# Patient Record
Sex: Female | Born: 1980 | Race: Black or African American | Hispanic: No | Marital: Single | Smoking: Current some day smoker
Health system: Southern US, Community
[De-identification: ages and names within clinical notes are randomized; demographics above are authoritative.]

## PROBLEM LIST (undated history)

## (undated) DIAGNOSIS — J45909 Unspecified asthma, uncomplicated: Secondary | ICD-10-CM

## (undated) HISTORY — PX: KNEE SURGERY: SHX244

## (undated) HISTORY — PX: COLPOSCOPY: SHX161

## (undated) HISTORY — PX: CERVICAL BIOPSY  W/ LOOP ELECTRODE EXCISION: SUR135

## (undated) HISTORY — DX: Unspecified asthma, uncomplicated: J45.909

## (undated) HISTORY — PX: OTHER SURGICAL HISTORY: SHX169

---

## 1999-01-31 ENCOUNTER — Other Ambulatory Visit: Admission: RE | Admit: 1999-01-31 | Discharge: 1999-01-31 | Payer: Self-pay | Admitting: Obstetrics and Gynecology

## 2005-09-11 ENCOUNTER — Ambulatory Visit: Payer: Self-pay | Admitting: Internal Medicine

## 2005-10-07 ENCOUNTER — Ambulatory Visit: Payer: Self-pay | Admitting: Internal Medicine

## 2006-01-13 ENCOUNTER — Emergency Department (HOSPITAL_COMMUNITY): Admission: EM | Admit: 2006-01-13 | Discharge: 2006-01-13 | Payer: Self-pay | Admitting: Emergency Medicine

## 2006-05-06 ENCOUNTER — Ambulatory Visit: Payer: Self-pay | Admitting: Internal Medicine

## 2006-07-09 ENCOUNTER — Ambulatory Visit: Payer: Self-pay | Admitting: Internal Medicine

## 2007-04-15 DIAGNOSIS — J45909 Unspecified asthma, uncomplicated: Secondary | ICD-10-CM | POA: Insufficient documentation

## 2009-03-14 ENCOUNTER — Emergency Department (HOSPITAL_COMMUNITY): Admission: EM | Admit: 2009-03-14 | Discharge: 2009-03-14 | Payer: Self-pay | Admitting: Emergency Medicine

## 2012-09-07 ENCOUNTER — Encounter: Payer: Self-pay | Admitting: Family Medicine

## 2012-09-07 ENCOUNTER — Ambulatory Visit (INDEPENDENT_AMBULATORY_CARE_PROVIDER_SITE_OTHER): Payer: Self-pay | Admitting: Family Medicine

## 2012-09-07 VITALS — BP 98/80 | HR 92 | Temp 100.0°F | Ht 71.5 in | Wt 171.0 lb

## 2012-09-07 DIAGNOSIS — Z87898 Personal history of other specified conditions: Secondary | ICD-10-CM | POA: Insufficient documentation

## 2012-09-07 DIAGNOSIS — J329 Chronic sinusitis, unspecified: Secondary | ICD-10-CM

## 2012-09-07 DIAGNOSIS — N912 Amenorrhea, unspecified: Secondary | ICD-10-CM

## 2012-09-07 DIAGNOSIS — F17200 Nicotine dependence, unspecified, uncomplicated: Secondary | ICD-10-CM

## 2012-09-07 DIAGNOSIS — Z8742 Personal history of other diseases of the female genital tract: Secondary | ICD-10-CM

## 2012-09-07 DIAGNOSIS — Z720 Tobacco use: Secondary | ICD-10-CM | POA: Insufficient documentation

## 2012-09-07 MED ORDER — AMOXICILLIN-POT CLAVULANATE 875-125 MG PO TABS: 1.0000 | ORAL_TABLET | Freq: Two times a day (BID) | ORAL | Status: DC

## 2012-09-07 MED ORDER — NICOTINE POLACRILEX 2 MG MT GUM: 2.0000 mg | CHEWING_GUM | OROMUCOSAL | Status: DC | PRN

## 2012-09-07 NOTE — Progress Notes (Signed)
Chief Complaint  Patient presents with  . Establish Care  . Cough    fever, facial pain, sinus pressure, rattling in chest, SOB, fatigue, chest pain and lower back pain, headache    HPI:  Catherine Diaz is here to establish care.  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 ASTHMA - dx in several years ago when had bronchitis, no symptoms since  . Tobacco use   . Sinusitis  . History of abnormal Pap smear   Sinus congestion: -started about 1 month ago, got better but then worse again over the last week -has had: nasal congestion, cough, sinus pain on R side, drainage in throat, L ear pain -denies: fever, NVD, tooth pain -hx of sinusitis -has tried musinex, sudafed, OTC medications   Tobacco Use: -quiting over last few weeks -down to 2 cigarettes per day -using E cigarette - but going to stop this too -motivated to quit  Health Maintenance: -needs pap - she reports she will call gyn - wants to see gyn due to her hx of LEEP and long term amenorrhea since she was a teenager  ROS: See pertinent positives and negatives per HPI.  Past Medical History  Diagnosis Date  . Asthma     Family History  Problem Relation Age of Onset  . Adopted: Yes  . Family history unknown: Yes    History   Social History  . Marital Status: Single    Spouse Name: N/A    Number of Children: N/A  . Years of Education: N/A   Social History Main Topics  . Smoking status: Current Every Day Smoker    Types: Cigarettes  . Smokeless tobacco: None     Comment: per patient 2 cig a day   . Alcohol Use: Yes     Comment: social - 2 drinks once per week  . Drug Use: No  . Sexually Active: Yes    Birth Control/ Protection: Condom     Comment: female, one   Other Topics Concern  . None   Social History Narrative   Work or School: works in [employer] Situation: living  aloneSpiritual Beliefs: christianLifestyle: no regular exercise, poor diet    Current outpatient prescriptions:amoxicillin-clavulanate (AUGMENTIN) 875-125 MG per tablet, Take 1 tablet by mouth 2 (two) times daily., Disp: 20 tablet, Rfl: 0;  nicotine polacrilex (EQ NICOTINE) 2 MG gum, Take 1 each (2 mg total) by mouth as needed for smoking cessation., Disp: 100 tablet, Rfl: 0  EXAM:  Filed Vitals:   09/07/12 1428  BP: 98/80  Pulse: 92  Temp: 100 F (37.8 C)    Body mass index is 23.52 kg/(m^2).  GENERAL: vitals reviewed and listed above, alert, oriented, appears well hydrated and in no acute distress  HEENT: atraumatic, conjunttiva clear, no obvious abnormalities on inspection of external nose and ears, normal appearance of ear canals and TMs, clear nasal congestion, mild post oropharyngeal erythema with PND, no tonsillar edema or exudate, no sinus TTP  NECK: no obvious masses on inspection  LUNGS: clear to auscultation bilaterally, no wheezes, rales or rhonchi, good air movement  CV: HRRR, no peripheral edema  MS: moves all extremities without noticeable abnormality  PSYCH: pleasant and cooperative, no obvious depression or anxiety  ASSESSMENT AND PLAN:  Discussed the following assessment and plan:  1. Tobacco use  nicotine polacrilex (EQ NICOTINE) 2 MG gum  2.  Sinusitis  amoxicillin-clavulanate (AUGMENTIN) 875-125 MG per tablet  3. History of abnormal Pap smear  Ambulatory referral to Gynecology  4. Amenorrhea  Ambulatory referral to Gynecology   -We reviewed the PMH, PSH, FH, SH, Meds and Allergies. -smoking cessation for > 5 minutes - discussed tx options, motivated to quit, she will try nicotine gum - rx given -for her sinusitis will do augmentin- risks discussed, return precautions -she will see the gynecologist for her hx for pap smear and amenorrhea (chronic) -she will follow up in 1 month and will do fasting labs then as she does not want to do them today given  insurance starts next week  -Patient advised to return or notify a doctor immediately if symptoms worsen or persist or new concerns arise.  Patient Instructions  -We have ordered labs or studies at this visit. It can take up to 1-2 weeks for results and processing. We will contact you with instructions IF your results are abnormal. Normal results will be released to your [facility]. If you have not heard from [place] or can not find your results in [facility] in 2 weeks please contact our office.  -PLEASE SIGN UP FOR MYCHART TODAY   Start the Augmenting for your sinus infection: -As we discussed, we have prescribed a new medication for you at this appointment. We discussed the common and serious potential adverse effects of this medication and you can review these and more with the pharmacist when you pick up your medication.  Please follow the instructions for use carefully and notify us immediately if you have any problems taking this medication.  Get rid of all cigarettes. Use the nicotine gum as needed for cravings per instructions. Follow up in 1 month.  We recommend the following healthy lifestyle measures: - eat a healthy diet consisting of lots of vegetables, fruits, beans, nuts, seeds, healthy meats such as white chicken and fish and whole grains.  - avoid fried foods, fast food, processed foods, sodas, red meet and other fattening foods.  - get a least 150 minutes of aerobic exercise per week.   Follow up in: 1 month for early morning appointment for labs and tobacco use follow up      Catherine Diaz, [facility] R.

## 2012-09-07 NOTE — Patient Instructions (Addendum)
-  We have ordered labs or studies at this visit. It can take up to 1-2 weeks for results and processing. We will contact you with instructions IF your results are abnormal. Normal results will be released to your [facility]. If you have not heard from [place] or can not find your results in [facility] in 2 weeks please contact our office.  -PLEASE SIGN UP FOR MYCHART TODAY   Start the Augmenting for your sinus infection: -As we discussed, we have prescribed a new medication for you at this appointment. We discussed the common and serious potential adverse effects of this medication and you can review these and more with the pharmacist when you pick up your medication.  Please follow the instructions for use carefully and notify us immediately if you have any problems taking this medication.  Get rid of all cigarettes. Use the nicotine gum as needed for cravings per instructions. Follow up in 1 month.  We recommend the following healthy lifestyle measures: - eat a healthy diet consisting of lots of vegetables, fruits, beans, nuts, seeds, healthy meats such as white chicken and fish and whole grains.  - avoid fried foods, fast food, processed foods, sodas, red meet and other fattening foods.  - get a least 150 minutes of aerobic exercise per week.   Follow up in: 1 month for early morning appointment for labs and tobacco use follow up

## 2012-09-08 ENCOUNTER — Encounter: Payer: Self-pay | Admitting: Family Medicine

## 2012-10-05 ENCOUNTER — Encounter: Payer: Self-pay | Admitting: Family Medicine

## 2012-10-05 DIAGNOSIS — Z0289 Encounter for other administrative examinations: Secondary | ICD-10-CM

## 2012-10-05 NOTE — Progress Notes (Signed)
Late cancel or no show  This encounter was created in error - please disregard.

## 2013-03-25 ENCOUNTER — Other Ambulatory Visit: Payer: Self-pay | Admitting: *Deleted

## 2013-03-25 DIAGNOSIS — N6320 Unspecified lump in the left breast, unspecified quadrant: Secondary | ICD-10-CM

## 2013-03-29 ENCOUNTER — Encounter (HOSPITAL_COMMUNITY): Payer: Self-pay

## 2013-03-29 ENCOUNTER — Ambulatory Visit (HOSPITAL_COMMUNITY)
Admission: RE | Admit: 2013-03-29 | Discharge: 2013-03-29 | Disposition: A | Discharge status: Home / Self Care | Payer: Self-pay | Source: Ambulatory Visit | Attending: Obstetrics and Gynecology | Admitting: Obstetrics and Gynecology

## 2013-03-29 VITALS — BP 124/82 | Temp 98.5°F | Ht 72.0 in | Wt 184.6 lb

## 2013-03-29 DIAGNOSIS — N6313 Unspecified lump in the right breast, lower outer quadrant: Secondary | ICD-10-CM

## 2013-03-29 DIAGNOSIS — Z01419 Encounter for gynecological examination (general) (routine) without abnormal findings: Secondary | ICD-10-CM

## 2013-03-29 DIAGNOSIS — N6322 Unspecified lump in the left breast, upper inner quadrant: Secondary | ICD-10-CM | POA: Insufficient documentation

## 2013-03-29 NOTE — Patient Instructions (Addendum)
Taught Catherine Diaz how to perform BSE and gave educational materials to take home.  Let patient know that she will need a Pap smear in 1 year if today's Pap smear is normal since her last Pap smear was abnormal. Referred patient to the [facility] for diagnostic mammogram and left breast ultrasound. Appointment scheduled for Thursday, April 14, 2013 at 1400. Patient aware of appointment and will be there. Let patient know will follow up with her within the next couple weeks with results by phone. Smoking cessation discussed with patient. [name] verbalized understanding.  [place], Kathaleen Maser, RN 4:19 PM

## 2013-03-29 NOTE — Progress Notes (Signed)
Patient complained of a left breast lump x 1 month that is painful. Patient rated pain at a 10 out of 10.  Pap Smear:    Pap smear completed today. Patients last Pap smear was in 2009 and abnormal per patient. Per patient has a history of a LEEP in 2009 to follow up for the abnormal Pap smear. Patient has not had a follow up Pap smear since last abnormal Pap smear. No Pap smear results in EPIC.  Physical exam: Breasts Breasts symmetrical. No skin abnormalities bilateral breasts. No nipple retraction bilateral breasts. No nipple discharge bilateral breasts. No lymphadenopathy. No lumps palpated right breast. Palpated two pea sized lumps in the left breast at 10 o'clocl 6 cm from the nipple and 8 o'clock 7 cm from the nipple. Patient complained of tenderness when palpated left lower inner breast where lumps were palpated. Referred patient to the [facility] for diagnostic mammogram and left breast ultrasound. Appointment scheduled for Thursday, April 14, 2013 at 1400.       Pelvic/Bimanual   Ext Genitalia No lesions, no swelling and no discharge observed on external genitalia.         Vagina Vagina pink and normal texture. No lesions or discharge observed in vagina.          Cervix Cervix is present. Cervix pink and of normal texture. No discharge observed.     Uterus Uterus is present and palpable. Uterus in normal position and normal size.        Adnexae Bilateral ovaries present and palpable. No tenderness on palpation.          Rectovaginal No rectal exam completed today since patient had no rectal complaints. No skin abnormalities observed on exam.

## 2013-03-31 ENCOUNTER — Other Ambulatory Visit: Payer: Self-pay | Admitting: *Deleted

## 2013-03-31 DIAGNOSIS — A599 Trichomoniasis, unspecified: Secondary | ICD-10-CM

## 2013-03-31 MED ORDER — METRONIDAZOLE 500 MG PO TABS: 500.0000 mg | ORAL_TABLET | Freq: Two times a day (BID) | ORAL | Status: DC

## 2013-04-01 ENCOUNTER — Telehealth (HOSPITAL_COMMUNITY): Payer: Self-pay | Admitting: *Deleted

## 2013-04-01 NOTE — Telephone Encounter (Signed)
Telephoned patient at home # and discussed pap smear was normal. Also advised pap did show trichomonas and would need to take medication for this. Patient's partner would also need to be treated. Due to past LEEP procedure and abnormal pap smears patient's next pap due in one year. Patient voiced understanding.

## 2013-04-14 ENCOUNTER — Ambulatory Visit
Admission: RE | Admit: 2013-04-14 | Discharge: 2013-04-14 | Disposition: A | Discharge status: Home / Self Care | Payer: No Typology Code available for payment source | Source: Ambulatory Visit | Attending: Obstetrics and Gynecology | Admitting: Obstetrics and Gynecology

## 2013-04-14 DIAGNOSIS — N6320 Unspecified lump in the left breast, unspecified quadrant: Secondary | ICD-10-CM

## 2013-08-06 ENCOUNTER — Emergency Department (HOSPITAL_COMMUNITY)
Admission: EM | Admit: 2013-08-06 | Discharge: 2013-08-06 | Disposition: A | Discharge status: Home / Self Care | Payer: Self-pay | Attending: Emergency Medicine | Admitting: Emergency Medicine

## 2013-08-06 ENCOUNTER — Emergency Department (HOSPITAL_COMMUNITY): Payer: Self-pay

## 2013-08-06 DIAGNOSIS — Z91040 Latex allergy status: Secondary | ICD-10-CM | POA: Insufficient documentation

## 2013-08-06 DIAGNOSIS — R112 Nausea with vomiting, unspecified: Secondary | ICD-10-CM | POA: Insufficient documentation

## 2013-08-06 DIAGNOSIS — R000 Tachycardia, unspecified: Secondary | ICD-10-CM | POA: Insufficient documentation

## 2013-08-06 DIAGNOSIS — J45909 Unspecified asthma, uncomplicated: Secondary | ICD-10-CM | POA: Insufficient documentation

## 2013-08-06 DIAGNOSIS — M545 Low back pain, unspecified: Secondary | ICD-10-CM | POA: Insufficient documentation

## 2013-08-06 DIAGNOSIS — Z3202 Encounter for pregnancy test, result negative: Secondary | ICD-10-CM | POA: Insufficient documentation

## 2013-08-06 DIAGNOSIS — F17200 Nicotine dependence, unspecified, uncomplicated: Secondary | ICD-10-CM | POA: Insufficient documentation

## 2013-08-06 DIAGNOSIS — B9789 Other viral agents as the cause of diseases classified elsewhere: Secondary | ICD-10-CM | POA: Insufficient documentation

## 2013-08-06 DIAGNOSIS — R197 Diarrhea, unspecified: Secondary | ICD-10-CM | POA: Insufficient documentation

## 2013-08-06 DIAGNOSIS — R630 Anorexia: Secondary | ICD-10-CM | POA: Insufficient documentation

## 2013-08-06 DIAGNOSIS — J020 Streptococcal pharyngitis: Secondary | ICD-10-CM | POA: Insufficient documentation

## 2013-08-06 DIAGNOSIS — R109 Unspecified abdominal pain: Secondary | ICD-10-CM | POA: Insufficient documentation

## 2013-08-06 DIAGNOSIS — R079 Chest pain, unspecified: Secondary | ICD-10-CM | POA: Insufficient documentation

## 2013-08-06 LAB — COMPREHENSIVE METABOLIC PANEL
ALT: 27 U/L (ref 0–35)
AST: 25 U/L (ref 0–37)
Alkaline Phosphatase: 96 U/L (ref 39–117)
Calcium: 9.3 mg/dL (ref 8.4–10.5)
Glucose, Bld: 107 mg/dL — ABNORMAL HIGH (ref 70–99)
Sodium: 133 mEq/L — ABNORMAL LOW (ref 135–145)
Total Protein: 7.5 g/dL (ref 6.0–8.3)

## 2013-08-06 LAB — POCT PREGNANCY, URINE: Preg Test, Ur: NEGATIVE

## 2013-08-06 LAB — CBC WITH DIFFERENTIAL/PLATELET
HCT: 39.4 % (ref 36.0–46.0)
Hemoglobin: 13.3 g/dL (ref 12.0–15.0)
Lymphocytes Relative: 9 % — ABNORMAL LOW (ref 12–46)
Lymphs Abs: 1.6 10*3/uL (ref 0.7–4.0)
MCV: 89.3 fL (ref 78.0–100.0)
Monocytes Relative: 12 % (ref 3–12)
Neutro Abs: 13.4 10*3/uL — ABNORMAL HIGH (ref 1.7–7.7)
Platelets: 258 10*3/uL (ref 150–400)

## 2013-08-06 LAB — LIPASE, BLOOD: Lipase: 25 U/L (ref 11–59)

## 2013-08-06 LAB — RAPID STREP SCREEN (MED CTR MEBANE ONLY): Streptococcus, Group A Screen (Direct): POSITIVE — AB

## 2013-08-06 MED ORDER — OXYCODONE-ACETAMINOPHEN 5-325 MG PO TABS: 2.0000 | ORAL_TABLET | ORAL | Status: DC | PRN

## 2013-08-06 MED ORDER — SODIUM CHLORIDE 0.9 % IV BOLUS (SEPSIS)
1000.0000 mL | Freq: Once | INTRAVENOUS | Status: AC
  Administered 2013-08-06: 1000 mL via INTRAVENOUS

## 2013-08-06 MED ORDER — ACETAMINOPHEN 325 MG PO TABS
650.0000 mg | ORAL_TABLET | Freq: Four times a day (QID) | ORAL | Status: DC | PRN
  Administered 2013-08-06 (×2): 650 mg via ORAL
  Filled 2013-08-06 (×2): qty 2

## 2013-08-06 MED ORDER — MAGIC MOUTHWASH
5.0000 mL | Freq: Once | ORAL | Status: AC
  Administered 2013-08-06: 5 mL via ORAL
  Filled 2013-08-06: qty 5

## 2013-08-06 MED ORDER — GUAIFENESIN 100 MG/5ML PO LIQD: 100.0000 mg | ORAL | Status: DC | PRN

## 2013-08-06 MED ORDER — PENICILLIN G BENZATHINE 1200000 UNIT/2ML IM SUSP
1.2000 10*6.[IU] | Freq: Once | INTRAMUSCULAR | Status: AC
  Administered 2013-08-06: 1.2 10*6.[IU] via INTRAMUSCULAR
  Filled 2013-08-06: qty 2

## 2013-08-06 NOTE — ED Notes (Signed)
Pt c/o NVD, fever, cough since christmas eve.

## 2013-08-06 NOTE — ED Provider Notes (Signed)
CSN: [id number]     Arrival date & time 08/06/13  1236 History   First MD Initiated Contact with Patient 08/06/13 1505     Chief Complaint  Patient presents with  . Fever  . Generalized Body Aches  . Nausea  . Emesis  . Diarrhea   (Consider location/radiation/quality/duration/timing/severity/associated sxs/prior Treatment) HPI  32 year old female with history of asthma presents with viral syndrome. Patient states she has had a cold for the past weeks which has improved however 3 days ago on Christmas eve her cold sxs returns. It begins with sore throat, body aches, fever, nonproductive cough, ear pain, and neck pain. She also states cough is worse in the middle of the night but improves throughout the day. She reports having nausea and has vomited 5-10 times with nonbilious non-bloody vomits 2 days ago but that has improved. She reports having chest pain abdominal pain and low back pain. She has tried taking over-the-counter TheraFlu with minimal relief. She has decreased appetite but is able to drink fluid. She has history of recurrent strep throat. Her asthma is otherwise well controlled. She denies hemoptysis, neck stiffness, shortness of breath, dysuria, hematuria, or rash. She is a nonsmoker.  Past Medical History  Diagnosis Date  . Asthma    Past Surgical History  Procedure Laterality Date  . Knee surgery    . Leep surgery 2009    . Colposcopy    . Cervical biopsy  w/ loop electrode excision     Family History  Problem Relation Age of Onset  . Adopted: Yes   History  Substance Use Topics  . Smoking status: Current Every Day Smoker    Types: Cigarettes  . Smokeless tobacco: Not on file     Comment: per patient 2 cig a day   . Alcohol Use: Yes     Comment: social - 2 drinks once per week   OB History   Grav Para Term Preterm Abortions TAB SAB Ect Mult Living   0              Review of Systems  All other systems reviewed and are negative.    Allergies   Latex  Home Medications   Current Outpatient Rx  Name  Route  Sig  Dispense  Refill  . nicotine polacrilex (EQ NICOTINE) 2 MG gum   Oral   Take 1 each (2 mg total) by mouth as needed for smoking cessation.   100 tablet   0    BP 117/77  Pulse 105  Temp(Src) 101.3 F (38.5 C) (Oral)  Resp 16  SpO2 100% Physical Exam  Nursing note and vitals reviewed. Constitutional: She is oriented to person, place, and time. She appears well-developed and well-nourished. No distress.  HENT:  Head: Atraumatic.  Right Ear: External ear normal.  Left Ear: External ear normal.  Ear: TM normal bilaterally  Nose: rhinorrhea  Throat: uvula midline, bilateral tonsillar enlargement and exudates.  Post tonsillar erythema.  No obvious evidence of deep tissue infection, no trismus.  Eyes: Conjunctivae are normal.  Neck: Neck supple.  No nuchal rigidity  Cardiovascular:  Mild tachycardia without M/R/G  Pulmonary/Chest: Effort normal. No respiratory distress. She has no wheezes. She has no rales.  Abdominal: Soft. There is no tenderness. There is no rigidity, no rebound, no CVA tenderness, no tenderness at McBurney's point and negative Murphy's sign. No hernia.  Lymphadenopathy:    She has cervical adenopathy.  Neurological: She is alert and oriented to person, place,  and time.  Skin: Skin is warm. No rash noted.  Psychiatric: She has a normal mood and affect.    ED Course  Procedures (including critical care time)  3:25 PM Pt here with URI sxs.  Has fever of 101.3, has leukocytosis of 17.2.  Is mildly tachycardic.  Suspect flu sxs, will obtain rapid strep test to r/o strept infection.  Will check CXR to r/o PNA.    6:01 PM Rapid strep test is positive for group A strep. Patient will receive Bicillin 1.2 million unit IM. Chest x-ray shows no evidence of pneumonia. Patient is currently able to tolerates by mouth. No obvious evidence of peritonsillar abscess or deep tissue infection. Magic  mouthwash given, IVF given  Labs Review Labs Reviewed  CBC WITH DIFFERENTIAL - Abnormal; Notable for the following:    WBC 17.2 (*)    Neutrophils Relative % 78 (*)    Neutro Abs 13.4 (*)    Lymphocytes Relative 9 (*)    Monocytes Absolute 2.1 (*)    All other components within normal limits  COMPREHENSIVE METABOLIC PANEL - Abnormal; Notable for the following:    Sodium 133 (*)    Glucose, Bld 107 (*)    Total Bilirubin 0.2 (*)    All other components within normal limits  LIPASE, BLOOD   Imaging Review Dg Chest 2 View  08/06/2013   CLINICAL DATA:  Cough, sore throat, body aches for 3 days, fever  EXAM: CHEST  2 VIEW  COMPARISON:  None  FINDINGS: Normal heart size, mediastinal contours, and pulmonary vascularity.  Lungs clear.  No pleural effusion or pneumothorax.  Bones unremarkable.  Cutaneous artifacts project over the sternum.  IMPRESSION: No acute abnormalities.   Electronically Signed   By: Ulyses Southward M.D.   On: 08/06/2013 15:36    EKG Interpretation   None       MDM   1. Strep pharyngitis    BP 115/66  Pulse 87  Temp(Src) 99 F (37.2 C) (Oral)  Resp 16  SpO2 97%  I have reviewed nursing notes and vital signs. I personally reviewed the imaging tests through PACS system  I reviewed available ER/hospitalization records thought the EMR     Fayrene Helper, [place] 08/06/13 1900

## 2013-08-06 NOTE — ED Notes (Signed)
Patient was educated not to drive, operate heavy machinery, or drink alcohol while taking narcotic medication.  [place], PA, made aware of pt's temperature upon discharge. PA states that patient is safe to go home after PRN tylenol.

## 2013-08-07 NOTE — ED Provider Notes (Signed)
Medical screening examination/treatment/procedure(s) were performed by non-physician practitioner and as supervising physician I was immediately available for consultation/collaboration.  EKG Interpretation   None         Beaulah Romanek S Tenille Morrill, MD 08/07/13 1152 

## 2013-11-01 ENCOUNTER — Ambulatory Visit (INDEPENDENT_AMBULATORY_CARE_PROVIDER_SITE_OTHER): Payer: No Typology Code available for payment source | Admitting: Family Medicine

## 2013-11-01 ENCOUNTER — Encounter: Payer: Self-pay | Admitting: Family Medicine

## 2013-11-01 VITALS — BP 110/80 | Temp 99.4°F | Wt 173.0 lb

## 2013-11-01 DIAGNOSIS — J020 Streptococcal pharyngitis: Secondary | ICD-10-CM

## 2013-11-01 DIAGNOSIS — J029 Acute pharyngitis, unspecified: Secondary | ICD-10-CM

## 2013-11-01 LAB — POCT RAPID STREP A (OFFICE): Rapid Strep A Screen: POSITIVE — AB

## 2013-11-01 MED ORDER — AMOXICILLIN 875 MG PO TABS: 875.0000 mg | ORAL_TABLET | Freq: Two times a day (BID) | ORAL | Status: DC

## 2013-11-01 NOTE — Progress Notes (Signed)
Pre visit review using our clinic review tool, if applicable. No additional management support is needed unless otherwise documented below in the visit note. 

## 2013-11-01 NOTE — Progress Notes (Signed)
Chief Complaint  Patient presents with  . Sore Throat    HPI:  -started: 3-4 days ago -symptoms:sore throat, malaise, sub fever -denies: SOB, NVD, tooth pain -has tried: nothing -sick contacts/travel/risks: denies flu exposure or Ebola risks but others with similar symptoms at work  ROS: See pertinent positives and negatives per HPI.  Past Medical History  Diagnosis Date  . Asthma     Past Surgical History  Procedure Laterality Date  . Knee surgery    . Leep surgery 2009    . Colposcopy    . Cervical biopsy  w/ loop electrode excision      Family History  Problem Relation Age of Onset  . Adopted: Yes    History   Social History  . Marital Status: Single    Spouse Name: N/A    Number of Children: N/A  . Years of Education: N/A   Social History Main Topics  . Smoking status: Current Every Day Smoker    Types: Cigarettes  . Smokeless tobacco: None     Comment: per patient 2 cig a day   . Alcohol Use: Yes     Comment: social - 2 drinks once per week  . Drug Use: No  . Sexual Activity: Yes    Birth Control/ Protection: Condom     Comment: female, one   Other Topics Concern  . None   Social History Narrative   Work or School: works in [employer]      Home Situation: living alone      Spiritual Beliefs: christian      Lifestyle: no regular exercise, poor diet             Current outpatient prescriptions:acetaminophen (TYLENOL) 160 MG/5ML liquid, Take 15 mg/kg by mouth every 4 (four) hours as needed for fever or pain., Disp: , Rfl: ;  ibuprofen (ADVIL,MOTRIN) 200 MG tablet, Take 200 mg by mouth every 6 (six) hours as needed., Disp: , Rfl:   EXAM:  Filed Vitals:   11/01/13 1356  BP: 110/80  Temp: 99.4 F (37.4 C)    Body mass index is 23.46 kg/(m^2).  GENERAL: vitals reviewed and listed above, alert, oriented, appears well hydrated and in no acute distress  HEENT: atraumatic, conjunttiva clear, no obvious abnormalities on inspection of external  nose and ears, normal appearance of ear canals and TMs, mild post oropharyngeal erythema , 2+ tonsillar edema with exudate, no sinus TTP  NECK: no obvious masses on inspection  LUNGS: clear to auscultation bilaterally, no wheezes, rales or rhonchi, good air movement  CV: HRRR, no peripheral edema  MS: moves all extremities without noticeable abnormality  PSYCH: pleasant and cooperative, no obvious depression or anxiety  ASSESSMENT AND PLAN:  Discussed the following assessment and plan:  Streptococcal sore throat  Sore throat - Plan: POC Rapid Strep A, Throat culture ([facility])  -rapid strep + -tx with amox and advise to see ENT, number provided as recurrent pharyngitis -of course, we advised to return or notify a doctor immediately if symptoms worsen or persist or new concerns arise.    There are no Patient Instructions on file for this visit.   Kriste BasqueKIM, Caspar Favila R.

## 2013-11-01 NOTE — Patient Instructions (Signed)
-  As we discussed, we have prescribed a new medication for you at this appointment. We discussed the common and serious potential adverse effects of this medication and you can review these and more with the pharmacist when you pick up your medication.  Please follow the instructions for use carefully and notify us immediately if you have any problems taking this medication.  -call for appointment with ENT given recurrent pharyngitis

## 2013-11-02 ENCOUNTER — Telehealth: Payer: Self-pay | Admitting: Family Medicine

## 2013-11-02 NOTE — Telephone Encounter (Signed)
Relevant patient education assigned to patient using Emmi. ° °

## 2015-06-01 ENCOUNTER — Emergency Department (HOSPITAL_COMMUNITY): Payer: No Typology Code available for payment source

## 2015-06-01 ENCOUNTER — Emergency Department (HOSPITAL_COMMUNITY)
Admission: EM | Admit: 2015-06-01 | Discharge: 2015-06-01 | Disposition: A | Discharge status: Home / Self Care | Payer: No Typology Code available for payment source | Attending: Emergency Medicine | Admitting: Emergency Medicine

## 2015-06-01 ENCOUNTER — Encounter (HOSPITAL_COMMUNITY): Payer: Self-pay | Admitting: Emergency Medicine

## 2015-06-01 DIAGNOSIS — S0993XA Unspecified injury of face, initial encounter: Secondary | ICD-10-CM | POA: Diagnosis present

## 2015-06-01 DIAGNOSIS — Y9389 Activity, other specified: Secondary | ICD-10-CM | POA: Diagnosis not present

## 2015-06-01 DIAGNOSIS — Y998 Other external cause status: Secondary | ICD-10-CM | POA: Diagnosis not present

## 2015-06-01 DIAGNOSIS — J45909 Unspecified asthma, uncomplicated: Secondary | ICD-10-CM | POA: Insufficient documentation

## 2015-06-01 DIAGNOSIS — Y92481 Parking lot as the place of occurrence of the external cause: Secondary | ICD-10-CM | POA: Diagnosis not present

## 2015-06-01 DIAGNOSIS — Z720 Tobacco use: Secondary | ICD-10-CM | POA: Diagnosis not present

## 2015-06-01 DIAGNOSIS — S199XXA Unspecified injury of neck, initial encounter: Secondary | ICD-10-CM | POA: Insufficient documentation

## 2015-06-01 DIAGNOSIS — Z91040 Latex allergy status: Secondary | ICD-10-CM | POA: Insufficient documentation

## 2015-06-01 DIAGNOSIS — S0083XA Contusion of other part of head, initial encounter: Secondary | ICD-10-CM | POA: Diagnosis not present

## 2015-06-01 MED ORDER — OXYCODONE-ACETAMINOPHEN 5-325 MG PO TABS
2.0000 | ORAL_TABLET | Freq: Once | ORAL | Status: AC
  Administered 2015-06-01: 2 via ORAL
  Filled 2015-06-01: qty 2

## 2015-06-01 MED ORDER — HYDROCODONE-ACETAMINOPHEN 5-325 MG PO TABS
1.0000 | ORAL_TABLET | ORAL | Status: DC | PRN
Start: 2015-06-01 — End: 2016-02-16
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
## 2015-06-01 NOTE — ED Notes (Signed)
Ice pack placed on patient's left face.
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
## 2015-11-23 ENCOUNTER — Emergency Department (HOSPITAL_COMMUNITY)
Admission: EM | Admit: 2015-11-23 | Discharge: 2015-11-23 | Disposition: A | Discharge status: Home / Self Care | Payer: No Typology Code available for payment source | Attending: Emergency Medicine | Admitting: Emergency Medicine

## 2015-11-23 ENCOUNTER — Emergency Department (HOSPITAL_COMMUNITY): Payer: No Typology Code available for payment source

## 2015-11-23 ENCOUNTER — Encounter (HOSPITAL_COMMUNITY): Payer: Self-pay | Admitting: Emergency Medicine

## 2015-11-23 DIAGNOSIS — R05 Cough: Secondary | ICD-10-CM | POA: Insufficient documentation

## 2015-11-23 DIAGNOSIS — F17210 Nicotine dependence, cigarettes, uncomplicated: Secondary | ICD-10-CM | POA: Insufficient documentation

## 2015-11-23 DIAGNOSIS — J45909 Unspecified asthma, uncomplicated: Secondary | ICD-10-CM | POA: Insufficient documentation

## 2015-11-23 DIAGNOSIS — R079 Chest pain, unspecified: Secondary | ICD-10-CM | POA: Insufficient documentation

## 2015-11-23 DIAGNOSIS — R0981 Nasal congestion: Secondary | ICD-10-CM | POA: Insufficient documentation

## 2015-11-23 DIAGNOSIS — J029 Acute pharyngitis, unspecified: Secondary | ICD-10-CM | POA: Insufficient documentation

## 2015-11-23 NOTE — ED Notes (Signed)
Pt c/o midsternal chest pain radiating to back, sore throat, chills, nasal congestion and cough x 1 week. Pt here with fiance, same s/s

## 2015-11-23 NOTE — ED Notes (Addendum)
Pt walked out the ED without notifying staff. EDP notified.

## 2015-12-20 ENCOUNTER — Emergency Department (HOSPITAL_COMMUNITY): Payer: No Typology Code available for payment source

## 2015-12-20 ENCOUNTER — Emergency Department (HOSPITAL_COMMUNITY)
Admission: EM | Admit: 2015-12-20 | Discharge: 2015-12-20 | Disposition: A | Discharge status: Home / Self Care | Payer: Self-pay | Attending: Emergency Medicine | Admitting: Emergency Medicine

## 2015-12-20 ENCOUNTER — Encounter (HOSPITAL_COMMUNITY): Payer: Self-pay | Admitting: Nurse Practitioner

## 2015-12-20 ENCOUNTER — Other Ambulatory Visit: Payer: Self-pay

## 2015-12-20 DIAGNOSIS — M940 Chondrocostal junction syndrome [Tietze]: Secondary | ICD-10-CM | POA: Insufficient documentation

## 2015-12-20 DIAGNOSIS — J45901 Unspecified asthma with (acute) exacerbation: Secondary | ICD-10-CM | POA: Insufficient documentation

## 2015-12-20 DIAGNOSIS — F17210 Nicotine dependence, cigarettes, uncomplicated: Secondary | ICD-10-CM | POA: Insufficient documentation

## 2015-12-20 DIAGNOSIS — Z91040 Latex allergy status: Secondary | ICD-10-CM | POA: Insufficient documentation

## 2015-12-20 LAB — BASIC METABOLIC PANEL
Anion gap: 11 (ref 5–15)
BUN: 13 mg/dL (ref 6–20)
CHLORIDE: 105 mmol/L (ref 101–111)
CO2: 26 mmol/L (ref 22–32)
CREATININE: 1.03 mg/dL — AB (ref 0.44–1.00)
Calcium: 9.4 mg/dL (ref 8.9–10.3)
GFR calc non Af Amer: >60 mL/min (ref >60)
Glucose, Bld: 81 mg/dL (ref 65–99)
Potassium: 4.3 mmol/L (ref 3.5–5.1)
Sodium: 142 mmol/L (ref 135–145)

## 2015-12-20 LAB — I-STAT TROPONIN, ED: Troponin i, poc: 0 ng/mL (ref 0.00–0.08)

## 2015-12-20 LAB — CBC
HCT: 40.9 % (ref 36.0–46.0)
Hemoglobin: 13.1 g/dL (ref 12.0–15.0)
MCH: 28.9 pg (ref 26.0–34.0)
MCHC: 32 g/dL (ref 30.0–36.0)
MCV: 90.1 fL (ref 78.0–100.0)
PLATELETS: 284 10*3/uL (ref 150–400)
RBC: 4.54 MIL/uL (ref 3.87–5.11)
RDW: 13.2 % (ref 11.5–15.5)
WBC: 5.9 10*3/uL (ref 4.0–10.5)

## 2015-12-20 MED ORDER — METHOCARBAMOL 500 MG PO TABS: 500.0000 mg | ORAL_TABLET | Freq: Two times a day (BID) | ORAL | Status: DC

## 2015-12-20 MED ORDER — NAPROXEN 500 MG PO TABS: 500.0000 mg | ORAL_TABLET | Freq: Two times a day (BID) | ORAL | Status: DC

## 2015-12-20 NOTE — ED Notes (Signed)
She c/o 2 day history of chest pain. Onset after reaching to help a falling person. Pain has gotten worse since onset and now radiating into shoulder and back. She reports some mild intermittent sob as well. Pain increased with movement. Describes as "burning, pulling." she took aleve and applied ice with no relief. she is alert and breathing easily.

## 2015-12-20 NOTE — ED Notes (Signed)
Catherine ManisElizabeth RN made aware of pt HR

## 2015-12-20 NOTE — Discharge Instructions (Signed)
Return to the ER if you developed cough with bloody sputum, increase shortness of breath, or if you have other concerns.  Chest Wall Pain Chest wall pain is pain in or around the bones and muscles of your chest. Sometimes, an injury causes this pain. Sometimes, the cause may not be known. This pain may take several weeks or longer to get better. HOME CARE Pay attention to any changes in your symptoms. Take these actions to help with your pain:  Rest as told by your doctor.  Avoid activities that cause pain. Try not to use your chest, belly (abdominal), or side muscles to lift heavy things.  If directed, apply ice to the painful area:  Put ice in a plastic bag.  Place a towel between your skin and the bag.  Leave the ice on for 20 minutes, 2-3 times per day.  Take over-the-counter and prescription medicines only as told by your doctor.  Do not use tobacco products, including cigarettes, chewing tobacco, and e-cigarettes. If you need help quitting, ask your doctor.  Keep all follow-up visits as told by your doctor. This is important. GET HELP IF:  You have a fever.  Your chest pain gets worse.  You have new symptoms. GET HELP RIGHT AWAY IF:  You feel sick to your stomach (nauseous) or you throw up (vomit).  You feel sweaty or light-headed.  You have a cough with phlegm (sputum) or you cough up blood.  You are short of breath.   This information is not intended to replace advice given to you by your health care provider. Make sure you discuss any questions you have with your health care provider.   Document Released: 01/14/2008 Document Revised: 04/18/2015 Document Reviewed: 10/23/2014 Elsevier Interactive Patient Education [employer]

## 2015-12-20 NOTE — ED Provider Notes (Signed)
CSN: [id number]     Arrival date & time 12/20/15  1605 History  By signing my name below, I, Emmanuella Mensah, attest that this documentation has been prepared under the direction and in the presence of Fayrene HelperBowie Daivon Rayos, PA-C. Electronically Signed: Angelene GiovanniEmmanuella Mensah, ED Scribe. 12/20/2015. 8:24 PM.    Chief Complaint  Patient presents with  . Chest Pain   The history is provided by the patient. No language interpreter was used.   HPI Comments: Catherine Diaz is a 35 y.o. female who presents to the Emergency Department complaining of gradually worsening pulling chest wall pain that radiates up her left shoulder and around her mid back onset 2 days ago. She reports associated SOB or pain with breathing. Pt explains that she lifted something heavy (bench) 2 days ago and felt as though she pulled a muscle. She states that she has tried ice and Aleve with no relief. Pt is a current half a pack cigarette smoker. She reports a family hx of PE with her brother due to surgery. She denies any chance of pregnancy. She denies any recent long trips, surgeries, or any hormone use. She denies any fever, coughing up blood, or leg swelling.   Past Medical History  Diagnosis Date  . Asthma    Past Surgical History  Procedure Laterality Date  . Knee surgery    . Leep surgery 2009    . Colposcopy    . Cervical biopsy  w/ loop electrode excision     Family History  Problem Relation Age of Onset  . Adopted: Yes   Social History  Substance Use Topics  . Smoking status: Current Every Day Smoker    Types: Cigarettes  . Smokeless tobacco: None     Comment: per patient 2 cig a day   . Alcohol Use: Yes     Comment: social - 2 drinks once per week   OB History    Gravida Para Term Preterm AB TAB SAB Ectopic Multiple Living   0              Review of Systems  Constitutional: Negative for fever.  Respiratory: Positive for shortness of breath. Negative for cough.   Cardiovascular: Negative for leg  swelling.  Musculoskeletal: Positive for myalgias and arthralgias.  All other systems reviewed and are negative.     Allergies  Latex  Home Medications   Prior to Admission medications   Medication Sig Start Date End Date Taking? Authorizing Provider  diphenhydrAMINE (SOMINEX) 25 MG tablet Take 50 mg by mouth daily as needed for allergies.    Historical Provider, MD  HYDROcodone-acetaminophen (NORCO/VICODIN) 5-325 MG tablet Take 1-2 tablets by mouth every 4 (four) hours as needed. 06/01/15   Robyn M Hess, PA-C  Ibuprofen-Diphenhydramine HCl (ADVIL PM) 200-25 MG CAPS Take 2 tablets by mouth at bedtime as needed (sleep).    Historical Provider, MD   BP 118/87 mmHg  Pulse 52  Temp(Src) 98.2 F (36.8 C) (Oral)  Resp 14  SpO2 100% Physical Exam  Constitutional: She is oriented to person, place, and time. She appears well-developed and well-nourished.  HENT:  Head: Normocephalic and atraumatic.  Cardiovascular: Normal rate and regular rhythm.  Exam reveals no gallop and no friction rub.   No murmur heard. Pulmonary/Chest: Effort normal and breath sounds normal. She has no wheezes. She has no rales. She exhibits tenderness.  Tenderness noted to left anterior chest wall, no crepitus, no step-off  Abdominal: Soft. There is no tenderness.  Neurological:  She is alert and oriented to person, place, and time.  Skin: Skin is warm and dry.  Psychiatric: She has a normal mood and affect.  Nursing note and vitals reviewed.   ED Course  Procedures (including critical care time) DIAGNOSTIC STUDIES: Oxygen Saturation is 100% on RA, normal by my interpretation.    COORDINATION OF CARE: 8:23 PM- Pt advised of plan for treatment and pt agrees. Pt will receive muscle relaxer. She was advised to return in a few days for a D-dimer if her SOB increases or the muscle relaxer is not providing any relief.  Return precautions discussed with coughing blood and increased SOB. Low suspicion for PE.   Likely MSK due to recent heavy lifting. i did offer option of d-dimer but pt prefers to return if her condition worsen.  She understand the risk/benefit and able to make informed decision.   Labs Review Labs Reviewed  BASIC METABOLIC PANEL - Abnormal; Notable for the following:    Creatinine, Ser 1.03 (*)    All other components within normal limits  CBC  I-STAT TROPOININ, ED    Imaging Review Dg Chest 2 View  12/20/2015  CLINICAL DATA:  Chest pain for 2 days EXAM: CHEST  2 VIEW COMPARISON:  November 23, 2015 FINDINGS: Lungs are clear. Heart size and pulmonary vascularity are normal. No adenopathy. Dermal piercings anterior to the sternum ir stable. No bone lesions. No pneumothorax. IMPRESSION: No edema or consolidation. Electronically Signed   By: Bretta Bang III M.D.   On: 12/20/2015 16:44   Fayrene Helper, PA-C has personally reviewed and evaluated these images and lab results as part of his medical decision-making.   EKG Interpretation None      Date: 12/20/2015  Rate: 75  Rhythm: normal sinus rhythm  QRS Axis: normal  Intervals: normal  ST/T Wave abnormalities: normal  Conduction Disutrbances: none  Narrative Interpretation:   Old EKG Reviewed: No significant changes noted     MDM   Final diagnoses:  Costochondritis    BP 118/87 mmHg  Pulse 52  Temp(Src) 98.2 F (36.8 C) (Oral)  Resp 14  SpO2 100%   I personally performed the services described in this documentation, which was scribed in my presence. The recorded information has been reviewed and is accurate.     Fayrene Helper, PA-C 12/20/15 2028  Lavera Guise, MD 12/20/15 2123

## 2016-02-16 ENCOUNTER — Encounter (HOSPITAL_COMMUNITY): Payer: Self-pay | Admitting: Emergency Medicine

## 2016-02-16 ENCOUNTER — Emergency Department (HOSPITAL_COMMUNITY)
Admission: EM | Admit: 2016-02-16 | Discharge: 2016-02-16 | Disposition: A | Discharge status: Home / Self Care | Payer: No Typology Code available for payment source | Attending: Emergency Medicine | Admitting: Emergency Medicine

## 2016-02-16 ENCOUNTER — Emergency Department (HOSPITAL_COMMUNITY): Payer: No Typology Code available for payment source

## 2016-02-16 DIAGNOSIS — F419 Anxiety disorder, unspecified: Secondary | ICD-10-CM | POA: Insufficient documentation

## 2016-02-16 DIAGNOSIS — F17210 Nicotine dependence, cigarettes, uncomplicated: Secondary | ICD-10-CM | POA: Insufficient documentation

## 2016-02-16 DIAGNOSIS — J45909 Unspecified asthma, uncomplicated: Secondary | ICD-10-CM | POA: Insufficient documentation

## 2016-02-16 DIAGNOSIS — Z7982 Long term (current) use of aspirin: Secondary | ICD-10-CM | POA: Insufficient documentation

## 2016-02-16 LAB — CBC
HCT: 37.7 % (ref 36.0–46.0)
Hemoglobin: 12.7 g/dL (ref 12.0–15.0)
MCH: 29.8 pg (ref 26.0–34.0)
MCHC: 33.7 g/dL (ref 30.0–36.0)
MCV: 88.5 fL (ref 78.0–100.0)
PLATELETS: 325 10*3/uL (ref 150–400)
RBC: 4.26 MIL/uL (ref 3.87–5.11)
RDW: 13.5 % (ref 11.5–15.5)
WBC: 5 10*3/uL (ref 4.0–10.5)

## 2016-02-16 LAB — BASIC METABOLIC PANEL
Anion gap: 6 (ref 5–15)
BUN: 10 mg/dL (ref 6–20)
CALCIUM: 9.5 mg/dL (ref 8.9–10.3)
CO2: 26 mmol/L (ref 22–32)
CREATININE: 0.77 mg/dL (ref 0.44–1.00)
Chloride: 105 mmol/L (ref 101–111)
Glucose, Bld: 104 mg/dL — ABNORMAL HIGH (ref 65–99)
Potassium: 3.3 mmol/L — ABNORMAL LOW (ref 3.5–5.1)
SODIUM: 137 mmol/L (ref 135–145)

## 2016-02-16 LAB — I-STAT TROPONIN, ED: Troponin i, poc: 0 ng/mL (ref 0.00–0.08)

## 2016-02-16 MED ORDER — LORAZEPAM 1 MG PO TABS
1.0000 mg | ORAL_TABLET | Freq: Once | ORAL | Status: AC
  Administered 2016-02-16: 1 mg via ORAL
  Filled 2016-02-16: qty 1

## 2016-02-16 MED ORDER — LORAZEPAM 1 MG PO TABS: 1.0000 mg | ORAL_TABLET | Freq: Three times a day (TID) | ORAL | Status: DC | PRN

## 2016-02-16 NOTE — ED Notes (Signed)
Patient c/o exceptional anxiety at discharge.  Patient encouraged to go home, get some rest and have dinner.  Patient encouraged to return to ED if she feels worse, has worsening chest pain or SOB.

## 2016-02-16 NOTE — Discharge Instructions (Signed)
Workup for the palpitations chest discomfort in the anxiety without any acute findings. Chest x-ray negative EKG normal cardiac marker was normal. Labs without significant abnormality. Cardiac monitoring without any arrhythmias. EKG was normal.  Trial of Ativan as needed for the next few days. Appointment follow-up with your regular doctor. Work note provided.    Panic Attacks Panic attacks are sudden, short feelings of great fear or discomfort. You may have them for no reason when you are relaxed, when you are uneasy (anxious), or when you are sleeping.  HOME CARE  Take all your medicines as told.  Check with your doctor before starting new medicines.  Keep all doctor visits. GET HELP IF:  You are not able to take your medicines as told.  Your symptoms do not get better.  Your symptoms get worse. GET HELP RIGHT AWAY IF:  Your attacks seem different than your normal attacks.  You have thoughts about hurting yourself or others.  You take panic attack medicine and you have a side effect. MAKE SURE YOU:  Understand these instructions.  Will watch your condition.  Will get help right away if you are not doing well or get worse.   This information is not intended to replace advice given to you by your health care provider. Make sure you discuss any questions you have with your health care provider.   Document Released: 08/30/2010 Document Revised: 05/18/2013 Document Reviewed: 03/11/2013 Elsevier Interactive Patient Education [employer]2016 [employer]vier Inc.

## 2016-02-16 NOTE — ED Provider Notes (Signed)
CSN: [id number]     Arrival date & time 02/16/16  1527 History   First MD Initiated Contact with Patient 02/16/16 1616     Chief Complaint  Patient presents with  . Anxiety  . Chest Pain     (Consider location/radiation/quality/duration/timing/severity/associated sxs/prior Treatment) Patient is a 35 y.o. female presenting with anxiety and chest pain. The history is provided by the patient.  Anxiety Associated symptoms include chest pain and shortness of breath. Pertinent negatives include no abdominal pain and no headaches.  Chest Pain Associated symptoms: anxiety, palpitations and shortness of breath   Associated symptoms: no abdominal pain, no back pain, no cough, no fever, no headache, no nausea and not vomiting    Patient is up this morning with running was getting ready for work when she's all of a sudden felt as if her heart was racing having chest tightness anxious feeling and feeling short of breath.  Patient has not had any similar symptoms in the past. Denies any leg swelling. Patient denies any injury.  The patient appears anxious.  Past Medical History  Diagnosis Date  . Asthma    Past Surgical History  Procedure Laterality Date  . Knee surgery    . Leep surgery 2009    . Colposcopy    . Cervical biopsy  w/ loop electrode excision     Family History  Problem Relation Age of Onset  . Adopted: Yes   Social History  Substance Use Topics  . Smoking status: Current Every Day Smoker    Types: Cigarettes  . Smokeless tobacco: None     Comment: per patient 2 cig a day   . Alcohol Use: Yes     Comment: social - 2 drinks once per week   OB History    Gravida Para Term Preterm AB TAB SAB Ectopic Multiple Living   0              Review of Systems  Constitutional: Negative for fever.  HENT: Negative for congestion.   Eyes: Negative for visual disturbance.  Respiratory: Positive for shortness of breath. Negative for cough.   Cardiovascular: Positive for chest  pain and palpitations. Negative for leg swelling.  Gastrointestinal: Negative for nausea, vomiting, abdominal pain and diarrhea.  Genitourinary: Negative for dysuria.  Musculoskeletal: Negative for back pain.  Skin: Negative for rash.  Neurological: Negative for headaches.  Hematological: Does not bruise/bleed easily.  Psychiatric/Behavioral: The patient is nervous/anxious.       Allergies  Latex  Home Medications   Prior to Admission medications   Medication Sig Start Date End Date Taking? Authorizing Provider  aspirin-acetaminophen-caffeine (EXCEDRIN MIGRAINE) [phone] MG tablet Take 2 tablets by mouth every 6 (six) hours as needed for headache.   Yes Historical Provider, MD  Ibuprofen-Diphenhydramine HCl (ADVIL PM) 200-25 MG CAPS Take 2 tablets by mouth at bedtime as needed (sleep).   Yes Historical Provider, MD  LORazepam (ATIVAN) 1 MG tablet Take 1 tablet (1 mg total) by mouth 3 (three) times daily as needed for anxiety. 02/16/16   Vanetta Mulders, MD  methocarbamol (ROBAXIN) 500 MG tablet Take 1 tablet (500 mg total) by mouth 2 (two) times daily. Patient not taking: Reported on 02/16/2016 12/20/15   Fayrene Helper, PA-C  naproxen (NAPROSYN) 500 MG tablet Take 1 tablet (500 mg total) by mouth 2 (two) times daily. Patient not taking: Reported on 02/16/2016 12/20/15   Fayrene Helper, PA-C   BP 136/86 mmHg  Pulse 96  Temp(Src) 100 F (37.8  C) (Oral)  Resp 20  SpO2 94% Physical Exam  Constitutional: She is oriented to person, place, and time. She appears well-developed and well-nourished. No distress.  HENT:  Head: Normocephalic and atraumatic.  Mouth/Throat: Oropharynx is clear and moist.  Eyes: Conjunctivae and EOM are normal. Pupils are equal, round, and reactive to light.  Neck: Normal range of motion. Neck supple.  Cardiovascular: Normal rate, regular rhythm and normal heart sounds.   No murmur heard. Pulmonary/Chest: Effort normal and breath sounds normal. No respiratory distress.   Abdominal: Soft. Bowel sounds are normal. There is no tenderness.  Musculoskeletal: Normal range of motion. She exhibits no edema.  Neurological: She is alert and oriented to person, place, and time. No cranial nerve deficit. She exhibits normal muscle tone. Coordination normal.  Skin: Skin is warm. No rash noted.  Nursing note and vitals reviewed.   ED Course  Procedures (including critical care time) Labs Review Labs Reviewed  BASIC METABOLIC PANEL - Abnormal; Notable for the following:    Potassium 3.3 (*)    Glucose, Bld 104 (*)    All other components within normal limits  CBC  I-STAT TROPOININ, ED   Results for orders placed or performed during the hospital encounter of 02/16/16  Basic metabolic panel  Result Value Ref Range   Sodium 137 135 - 145 mmol/L   Potassium 3.3 (L) 3.5 - 5.1 mmol/L   Chloride 105 101 - 111 mmol/L   CO2 26 22 - 32 mmol/L   Glucose, Bld 104 (H) 65 - 99 mg/dL   BUN 10 6 - 20 mg/dL   Creatinine, Ser 1.610.77 0.44 - 1.00 mg/dL   Calcium 9.5 8.9 - 09.610.3 mg/dL   GFR calc non Af Amer >60 >60 mL/min   GFR calc Af Amer >60 >60 mL/min   Anion gap 6 5 - 15  CBC  Result Value Ref Range   WBC 5.0 4.0 - 10.5 K/uL   RBC 4.26 3.87 - 5.11 MIL/uL   Hemoglobin 12.7 12.0 - 15.0 g/dL   HCT 04.537.7 40.936.0 - 81.146.0 %   MCV 88.5 78.0 - 100.0 fL   MCH 29.8 26.0 - 34.0 pg   MCHC 33.7 30.0 - 36.0 g/dL   RDW 91.413.5 78.211.5 - 95.615.5 %   Platelets 325 150 - 400 K/uL  I-stat troponin, ED  Result Value Ref Range   Troponin i, poc 0.00 0.00 - 0.08 ng/mL   Comment 3             Imaging Review Dg Chest 2 View  02/16/2016  CLINICAL DATA:  Chest pain EXAM: CHEST  2 VIEW COMPARISON:  12/20/2015 chest radiograph. FINDINGS: Stable cardiomediastinal silhouette with normal heart size. No pneumothorax. No pleural effusion. Lungs appear clear, with no acute consolidative airspace disease and no pulmonary edema. IMPRESSION: No active cardiopulmonary disease. Electronically Signed   By: Delbert PhenixJason A  Poff M.D.   On: 02/16/2016 16:19   I have personally reviewed and evaluated these images and lab results as part of my medical decision-making.   EKG Interpretation   Date/Time:  Saturday February 16 2016 15:58:53 EDT Ventricular Rate:  88 PR Interval:    QRS Duration: 88 QT Interval:  344 QTC Calculation: 417 R Axis:   87 Text Interpretation:  Sinus rhythm Probable left atrial enlargement Left  ventricular hypertrophy Confirmed by Deretha EmoryZACKOWSKI  MD, Lorin PicketSCOTT ([id number]) on  02/16/2016 4:16:50 PM Also confirmed by Deretha EmoryZACKOWSKI  MD, Jeidi Gilles [phone])  on  02/16/2016 4:35:28 PM  MDM   Final diagnoses:  Anxiety    Patient with acute onset of an anxious feeling chest tightness feeling short of breath. Feeling of heart racing and palpitations. Cardiac monitoring here showed no arrhythmias. Chest x-ray was negative for any acute findings no evidence of pneumonia or pneumothorax. EKG was normal. Troponin was normal. No leukocytosis no significant anemia. Electrolytes without any significant abnormality of any slightly low potassium at 3.3. That could be just due to hyperventilation. Pulse ox has been in the upper 90s. Mostly around 100%. No real concerns for pulmonary embolus. No leg swelling.  Patient does admit to smoking marijuana last night it may be playing a role.  Patient treated here with Ativan.    Vanetta Mulders, MD 02/16/16 1758

## 2016-02-16 NOTE — ED Notes (Addendum)
Pt here via EMS with complaints of anxiety and chest tightness that began when she arrived at work at [employer]. Pt states that prior to that, she was working out and running. Pt is tremulous at time of assessment. Pt states it feels as if her  heart is pumping "out of her chest" pt is breathing fast, but otherwise has vitals WNL. Pt also has complaints of chest tightness that began when she was running. Pt states the pain does not radiate anywhere and pt is not lightheaded or dizzy. Pt states this feels different from prior anxiety attacks, because she normally does not have chest tightness
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
## 2017-04-30 ENCOUNTER — Encounter: Payer: Self-pay | Admitting: Family Medicine

## 2017-05-06 IMAGING — CR DG CHEST 2V
2 series · 2 of 2 positions shown · non-contrast
Comparison: 08/06/2013

CLINICAL DATA: Flu-like symptoms for 2 weeks.  Left chest pain.

EXAM:
CHEST  2 VIEW

[w chest pa]
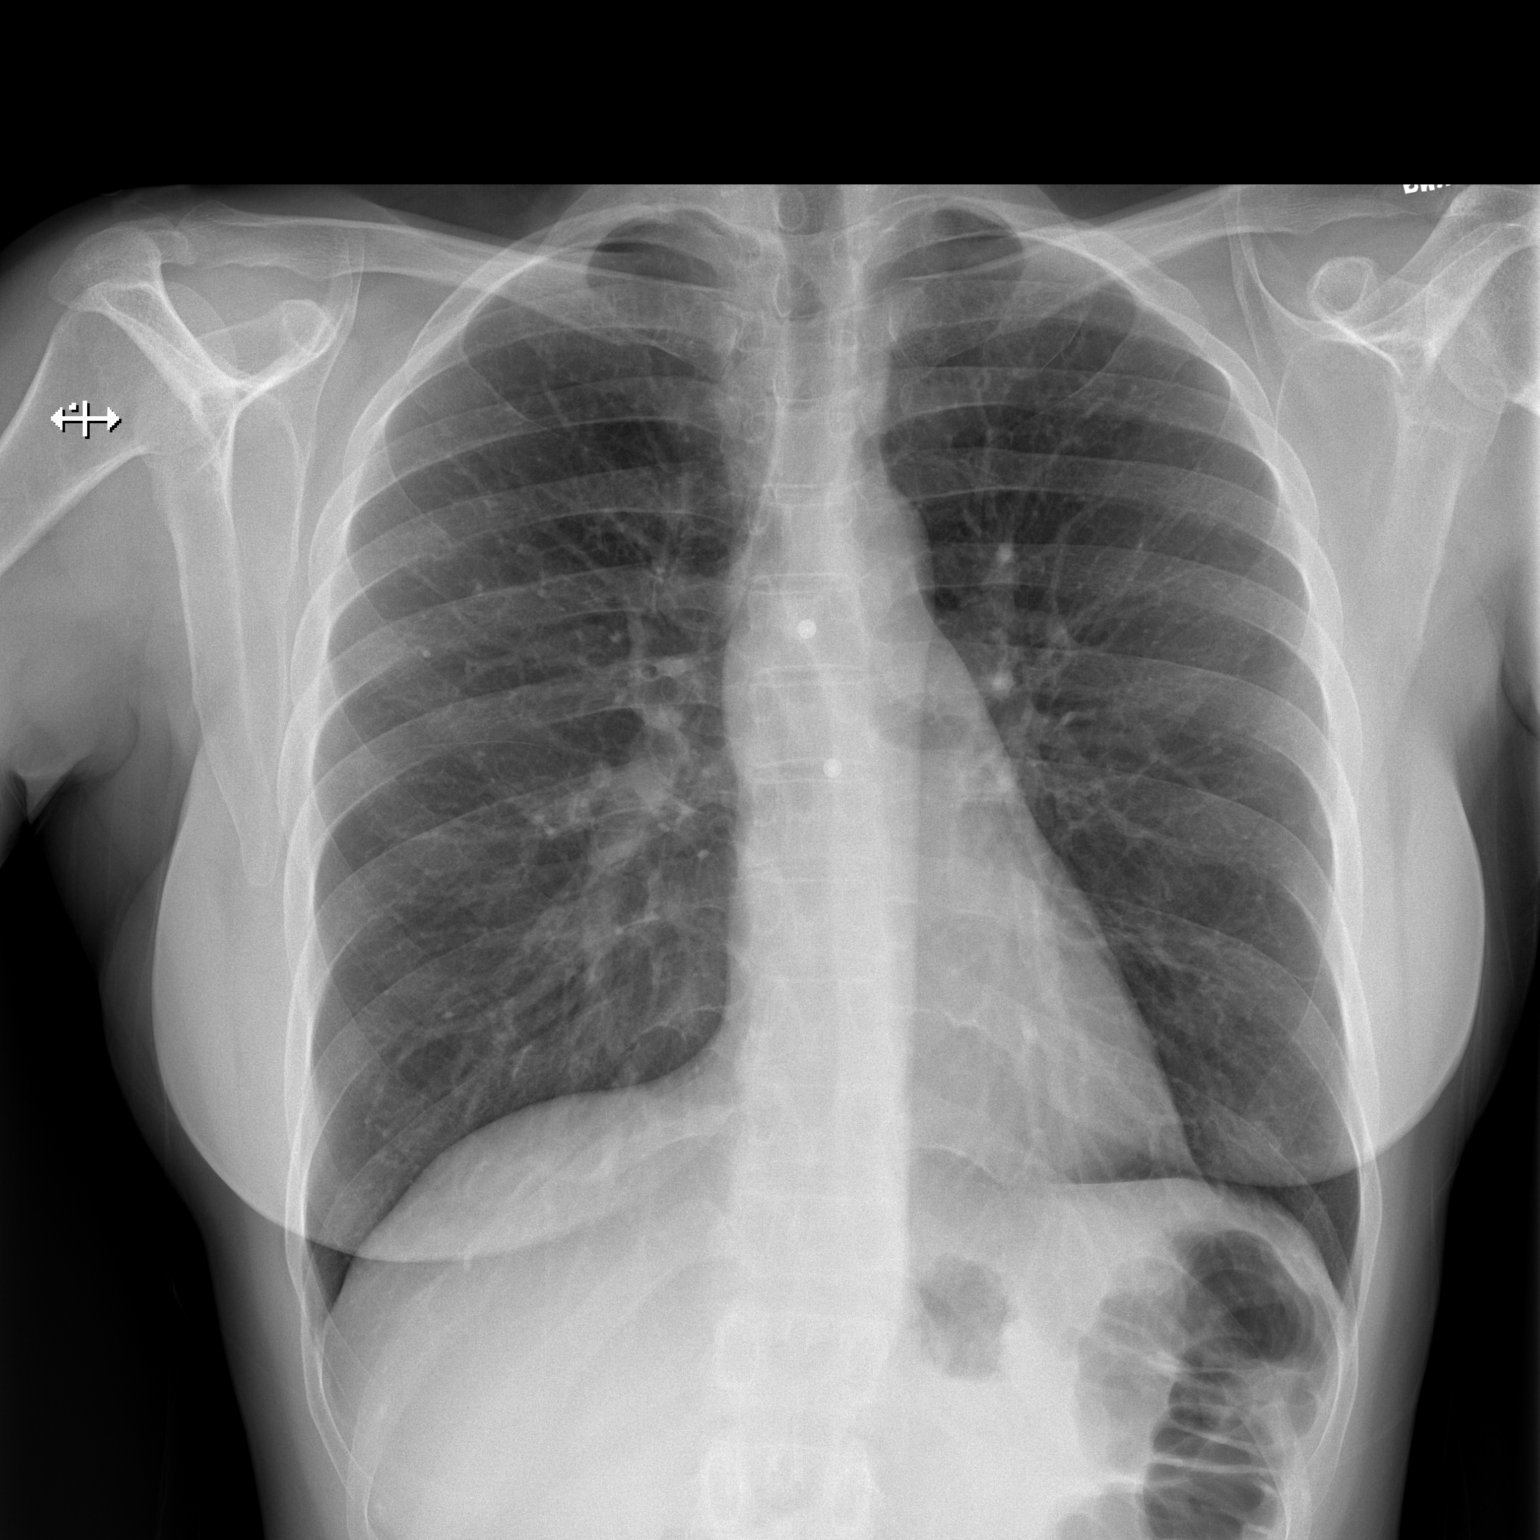

[w chest lat]
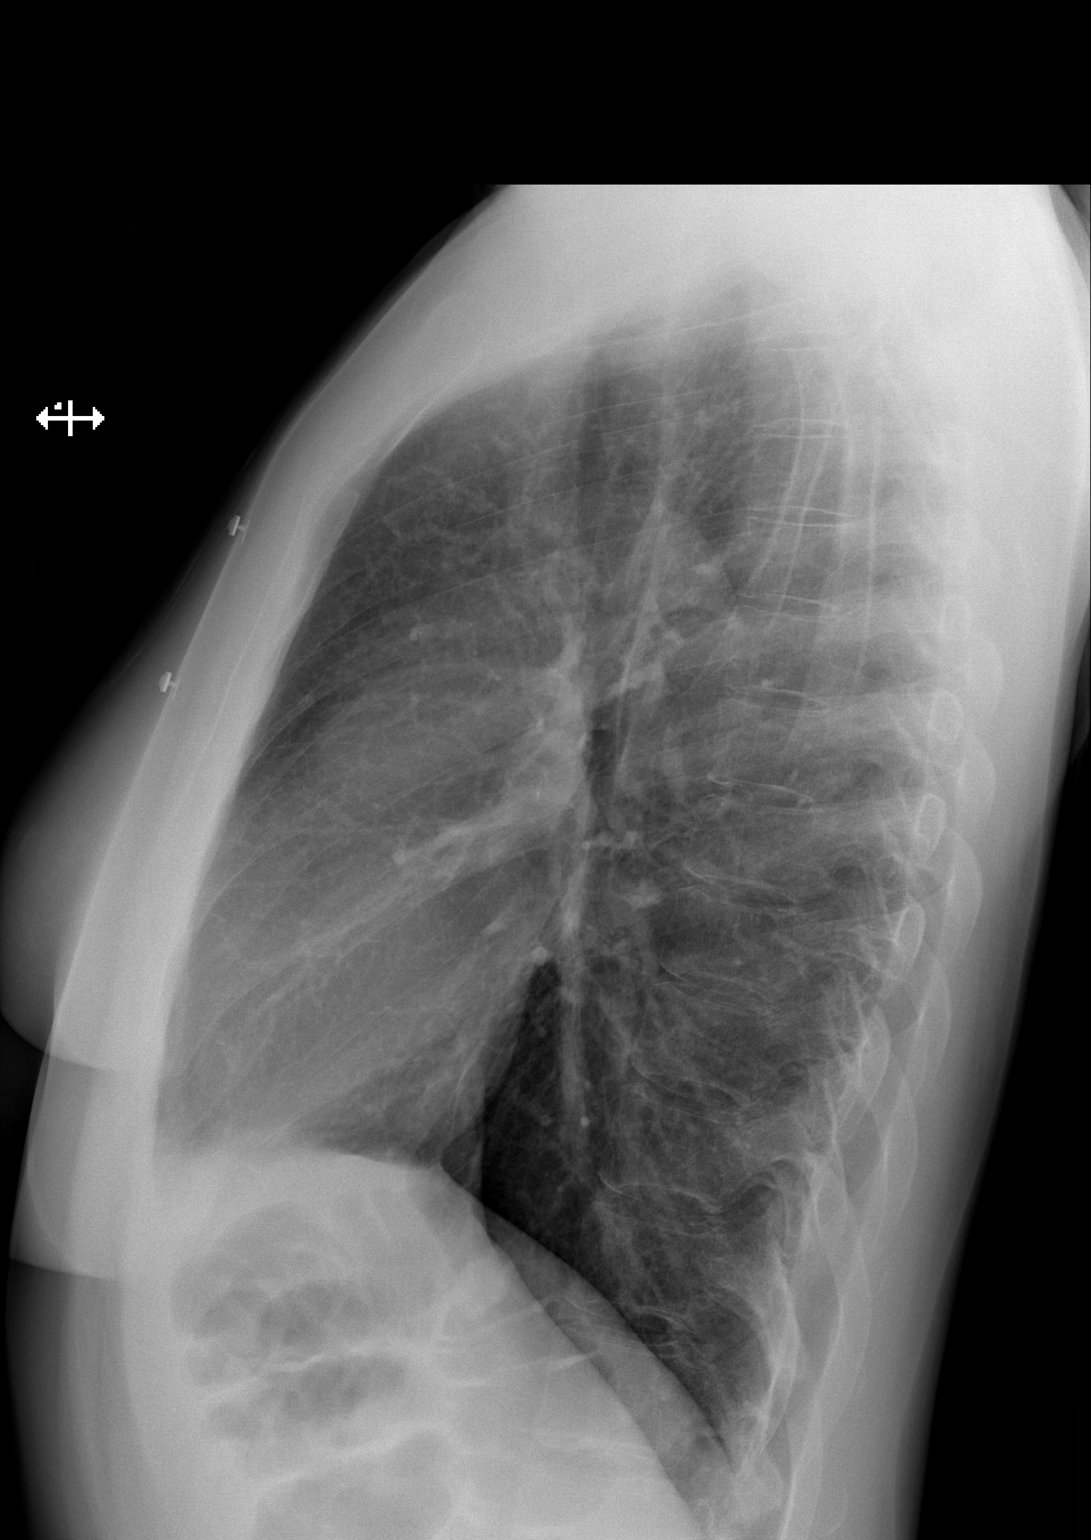

[2 of 2 positions shown; findings below may reference images not displayed]

FINDINGS: The heart size and mediastinal contours are within normal limits.
Both lungs are clear. The visualized skeletal structures are
unremarkable.
IMPRESSION: No active cardiopulmonary disease.

## 2017-06-24 ENCOUNTER — Encounter (HOSPITAL_COMMUNITY): Payer: Self-pay

## 2017-08-20 ENCOUNTER — Encounter: Payer: Self-pay | Admitting: Family Medicine

## 2018-04-01 ENCOUNTER — Emergency Department (HOSPITAL_COMMUNITY): Payer: Self-pay

## 2018-04-01 ENCOUNTER — Emergency Department (HOSPITAL_COMMUNITY)
Admission: EM | Admit: 2018-04-01 | Discharge: 2018-04-01 | Disposition: A | Discharge status: Home / Self Care | Payer: Self-pay | Attending: Emergency Medicine | Admitting: Emergency Medicine

## 2018-04-01 DIAGNOSIS — Z79899 Other long term (current) drug therapy: Secondary | ICD-10-CM | POA: Insufficient documentation

## 2018-04-01 DIAGNOSIS — F17210 Nicotine dependence, cigarettes, uncomplicated: Secondary | ICD-10-CM | POA: Insufficient documentation

## 2018-04-01 DIAGNOSIS — J45909 Unspecified asthma, uncomplicated: Secondary | ICD-10-CM | POA: Insufficient documentation

## 2018-04-01 DIAGNOSIS — Z7982 Long term (current) use of aspirin: Secondary | ICD-10-CM | POA: Insufficient documentation

## 2018-04-01 DIAGNOSIS — M5442 Lumbago with sciatica, left side: Secondary | ICD-10-CM | POA: Insufficient documentation

## 2018-04-01 LAB — I-STAT BETA HCG BLOOD, ED (MC, WL, AP ONLY)

## 2018-04-01 MED ORDER — METHOCARBAMOL 500 MG PO TABS: 500.0000 mg | ORAL_TABLET | Freq: Two times a day (BID) | ORAL | 0 refills | Status: DC

## 2018-04-01 MED ORDER — KETOROLAC TROMETHAMINE 15 MG/ML IJ SOLN: 15.0000 mg | Freq: Once | INTRAMUSCULAR | Status: DC

## 2018-04-01 MED ORDER — PREDNISONE 10 MG PO TABS: 40.0000 mg | ORAL_TABLET | Freq: Every day | ORAL | 0 refills | Status: AC

## 2018-04-01 MED ORDER — KETOROLAC TROMETHAMINE 15 MG/ML IJ SOLN
15.0000 mg | Freq: Once | INTRAMUSCULAR | Status: AC
  Administered 2018-04-01: 15 mg via INTRAMUSCULAR
  Filled 2018-04-01: qty 1

## 2018-04-01 MED ORDER — PREDNISONE 10 MG PO TABS: 40.0000 mg | ORAL_TABLET | Freq: Every day | ORAL | 0 refills | Status: DC

## 2018-04-01 NOTE — ED Provider Notes (Addendum)
[facility] EMERGENCY DEPARTMENT Provider Note   CSN: [id number] Arrival date & time: 04/01/18  1211     History   Chief Complaint Chief Complaint  Patient presents with  . Leg Pain    HPI Catherine Diaz is a 37 y.o. female presenting for left-sided lower back pain that radiates down her left leg.  Patient states that she has had lower back pain since 2014 when she was in the [employer] after a fall.  Patient states that since then she has been seen by [employer] doctors without complication.  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  Patient states approximately 1 week ago she is noticed that the pain starts to shoot down her left leg.  She states that the pain is worse with movement at work and is not present when she is resting.  Patient describes the pain as a shooting 5/10 in severity.  Patient states that the Advil has not helped relieve this pain.  Patient denies fevers, history of IV drug use, numbness weakness or tingling to any of her extremities, bowel or bladder incontinence. Patient did endorse a short amount of constipation yesterday where she felt like she was having trouble defecating, she states that she might have had rectal numbness but she is unsure.  Patient denies this happening since yesterday, endorses normal bowel movements since then.  Patient denies any urinary incontinence or equal incontinence.  HPI  Past Medical History:  Diagnosis Date  . Asthma     Patient Active Problem List   Diagnosis Date Noted  . Breast lump on left side at 10 o'clock position 03/29/2013  . Breast lump on right side at 8 o'clock position 03/29/2013  . Tobacco use 09/07/2012  . Sinusitis 09/07/2012  . History of abnormal Pap smear 09/07/2012  . ASTHMA 04/15/2007    Past Surgical History:  Procedure Laterality Date  .  CERVICAL BIOPSY  W/ LOOP ELECTRODE EXCISION    . COLPOSCOPY    . KNEE SURGERY    . leep surgery 2009       OB History    Gravida  0   Para      Term      Preterm      AB      Living        SAB      TAB      Ectopic      Multiple      Live Births               Home Medications    Prior to Admission medications   Medication Sig Start Date End Date Taking? Authorizing Provider  aspirin-acetaminophen-caffeine (EXCEDRIN MIGRAINE) [phone] MG tablet Take 2 tablets by mouth every 6 (six) hours as needed for headache.    [provider]  Ibuprofen-Diphenhydramine HCl (ADVIL PM) 200-25 MG CAPS Take 2 tablets by mouth at bedtime as needed (sleep).    [provider]  LORazepam (ATIVAN) 1 MG tablet Take 1 tablet (1 mg total) by mouth 3 (three) times daily as needed for anxiety. 02/16/16   Vanetta Mulders, MD  methocarbamol (ROBAXIN) 500 MG tablet Take 1 tablet (500 mg total) by mouth 2 (two) times daily. 04/01/18   Harlene Salts A, PA-C  naproxen (NAPROSYN) 500 MG tablet Take 1 tablet (500 mg  total) by mouth 2 (two) times daily. Patient not taking: Reported on 02/16/2016 12/20/15   Fayrene Helper, PA-C  predniSONE (DELTASONE) 10 MG tablet Take 4 tablets (40 mg total) by mouth daily for 5 days. 04/01/18 04/06/18  Bill Salinas, PA-C    Family History Family History  Adopted: Yes    Social History Social History   Tobacco Use  . Smoking status: Current Every Day Smoker    Types: Cigarettes  . Tobacco comment: per patient 2 cig a day   Substance Use Topics  . Alcohol use: Yes    Comment: social - 2 drinks once per week  . Drug use: No     Allergies   Latex   Review of Systems Review of Systems  Constitutional: Negative.  Negative for chills, fatigue and fever.  Genitourinary: Negative for difficulty urinating, dysuria, frequency and urgency.  Musculoskeletal: Positive for back pain. Negative for arthralgias, joint swelling and neck pain.   Skin: Negative.  Negative for color change and wound.  Neurological: Negative.  Negative for dizziness, syncope, weakness, light-headedness, numbness and headaches.       Negative for bowel or bladder incontinence.     Physical Exam Updated Vital Signs BP (!) 127/97 (BP Location: Right Arm)   Pulse 67   Temp 98.8 F (37.1 C) (Oral)   Resp 17   SpO2 100%   Physical Exam  Constitutional: She is oriented to person, place, and time. She appears well-developed and well-nourished. No distress.  HENT:  Head: Normocephalic and atraumatic.  Right Ear: External ear normal.  Left Ear: External ear normal.  Nose: Nose normal.  Eyes: Pupils are equal, round, and reactive to light. EOM are normal.  Neck: Trachea normal and normal range of motion. No tracheal deviation present.  Cardiovascular:  Pulses:      Dorsalis pedis pulses are 3+ on the right side, and 3+ on the left side.       Posterior tibial pulses are 3+ on the right side, and 3+ on the left side.  Pulmonary/Chest: Effort normal. No respiratory distress.  Abdominal: Soft. There is no tenderness. There is no rebound and no guarding.  Genitourinary: Rectum normal. Rectal exam shows no mass and anal tone normal.  Genitourinary Comments: Rectal exam chaperoned by Maxine Glenn.  Normal rectal tone and strength.  Musculoskeletal: Normal range of motion.       Right knee: Normal.       Left knee: Normal.       Right ankle: Normal.       Left ankle: Normal.       Lumbar back: She exhibits tenderness. She exhibits normal range of motion, no bony tenderness, no swelling, no edema and no deformity.       Back:       Right upper leg: Normal.       Left upper leg: Normal.       Right lower leg: Normal.       Left lower leg: Normal.       Right foot: Normal.       Left foot: Normal.  No midline spinal tenderness to palpation, no paraspinal muscle tenderness, no deformity, crepitus, or step-off noted  Patient with left-sided lumbar  muscle spasm noted.  Tenderness to left gluteal muscles. Positive straight leg test.  Feet:  Right Foot:  Protective Sensation: 3 sites tested. 3 sites sensed.  Left Foot:  Protective Sensation: 3 sites tested. 3 sites sensed.  Neurological: She is  alert and oriented to person, place, and time. She has normal strength. No sensory deficit.  Mental Status: Alert, oriented, thought content appropriate, able to give a coherent history. Speech fluent without evidence of aphasia. Able to follow 2 step commands without difficulty. Cranial Nerves: II-XII: Grossly intact Motor: Normal tone. 5/5 strength in upper and lower extremities bilaterally including strong and equal grip strength and dorsiflexion/plantar flexion Sensory: Sensation intact to light touch in all extremities.  Cerebellar: Normal heel-to -shin balance bilaterally of the lower extremity.  Gait: normal gait and balance CV: distal pulses palpable throughout  Skin: Skin is warm and dry. Capillary refill takes less than 2 seconds.  Psychiatric: She has a normal mood and affect. Her behavior is normal.     ED Treatments / Results  Labs (all labs ordered are listed, but only abnormal results are displayed) Labs Reviewed  I-STAT BETA HCG BLOOD, ED (MC, [facility], AP ONLY)    EKG None  Radiology Dg Lumbar Spine Complete  Result Date: 04/01/2018 CLINICAL DATA:  37 y/o F; chronic lower back pain. Worse on the left side. EXAM: LUMBAR SPINE - COMPLETE 4+ VIEW COMPARISON:  None. FINDINGS: There is no evidence of lumbar spine fracture. Mild S-shaped curvature of the lumbar spine. Normal lumbar lordosis without listhesis. Mild loss of height of the L4-5 and L5-S1 intervertebral disc spaces. IMPRESSION: 1. No acute fracture or listhesis. 2. Mild S-shaped curvature of the spine. 3. Mild loss of intervertebral disc space height at the L4-5 and L5-S1 levels. Electronically Signed   By: Mitzi Hansen M.D.   On: 04/01/2018 15:01     Procedures Procedures (including critical care time)  Medications Ordered in ED Medications  ketorolac (TORADOL) 15 MG/ML injection 15 mg (15 mg Intramuscular Given 04/01/18 1550)     Initial Impression / Assessment and Plan / ED Course  I have reviewed the triage vital signs and the nursing notes.  Pertinent labs & imaging results that were available during my care of the patient were reviewed by me and considered in my medical decision making (see chart for details).  Clinical Course as of Apr 02 1555  Thu Apr 01, 2018  1548 Rectal exam chaperoned by Maxine Glenn. Strong tone no blood noted.   [BM]    Clinical Course User Index [BM] Bill Salinas, PA-C   Patient presenting with left sided back pain.   No neurological deficits and normal neuro exam.  Patient can walk but states is painful.  Patient denies loss of bowel/bladder control or saddle area paresthesias.  Normal rectal tone on exam. No concern for cauda equina.  No fever, night sweats, weight loss, h/o cancer, or IVDU. RICE protocol and pain medicine indicated and discussed with patient.   Negative pregnancy test. Imaging without acute findings.  Patient denies history of CKD or gastric bleeding, 15 mg of Toradol given in department. Prescription for Robaxin given, patient informed not to drive or operate machinery while taking this medication. Patient denies history of diabetes, short course of prednisone given 40 mg for 5 days.  Patient encouraged to follow-up with her primary care provider regarding her visit today. Patient also given orthopedic follow-up regarding her visit today.  At this time there does not appear to be any evidence of an acute emergency medical condition and the patient appears stable for discharge with appropriate outpatient follow up. Diagnosis was discussed with patient who verbalizes understanding of care plan and is agreeable to discharge. I have discussed return precautions with  patient  and family at bedside who verbalize understanding of return precautions. Patient strongly encouraged to follow-up with their PCP. All questions answered.  This patient's care was discussed with Dr. Rush Landmarkegeler who agrees with discharge with follow-up at this time.   Note: Portions of this report may have been transcribed using voice recognition software. Every effort was made to ensure accuracy; however, inadvertent computerized transcription errors may still be present.    Final Clinical Impressions(s) / ED Diagnoses   Final diagnoses:  Left-sided low back pain with left-sided sciatica, unspecified chronicity    ED Discharge Orders         Ordered    methocarbamol (ROBAXIN) 500 MG tablet  2 times daily     04/01/18 1553    predniSONE (DELTASONE) 10 MG tablet  Daily,   Status:  Discontinued     04/01/18 1553    predniSONE (DELTASONE) 10 MG tablet  Daily     04/01/18 1554           Bill SalinasMorelli, Brileigh Sevcik A, PA-C 04/01/18 1801    Bill SalinasMorelli, Fernanda Twaddell A, PA-C 04/01/18 1802    Tegeler, Canary Brimhristopher J, MD 04/01/18 2028

## 2018-04-01 NOTE — ED Triage Notes (Signed)
PT reports lower back pain shooting down left side hip and leg. Burning pain. Does lots of manual labor.

## 2018-04-01 NOTE — Discharge Instructions (Addendum)
Please return to the Emergency Department for any new or worsening symptoms or if your symptoms do not improve. Please be sure to follow up with your Primary Care Physician as soon as possible regarding your visit today. If you do not have a Primary Doctor please use the resources below to establish one. Please take the prednisone as prescribed, 40 mg once a day for 5 days. You may use the muscle relaxer Robaxin as prescribed.  Do not drive or operate heavy machine-year-old taking this medication. You have been given a Toradol shot here in the department, this should continue to relieve your pain for the next few days, please avoid use of other NSAIDs like ibuprofen over the next few days.  Please drink plenty of water. X-ray imaging today did not show any fractures however did show a S shaped to spine, this could be contributing to your sciatica, please follow-up with your primary care provider regarding this.  You may also follow-up with Dr. Carola Frost the orthopedic doctor for further evaluation and treatment as well.  Contact a health care provider if: You have pain that wakes you up when you are sleeping. You have pain that gets worse when you lie down. Your pain is worse than you have experienced in the past. Your pain lasts longer than 4 weeks. You experience unexplained weight loss. Get help right away if: You lose control of your bowel or bladder (incontinence). You have: Weakness in your lower back, pelvis, buttocks, or legs that gets worse. Redness or swelling of your back. A burning sensation when you urinate.  RESOURCE GUIDE  Chronic Pain Problems: Contact [facility]  [phone] Patients need to be referred by their primary care doctor.  Insufficient Money for Medicine: Contact United Way:  call "211" or [facility] ([phone]) [phone].  No Primary Care Doctor: Call Health Connect  [phone] - can help you locate a primary care doctor that  accepts your  insurance, provides certain services, etc. Physician Referral Service- [phone]  Agencies that provide inexpensive medical care: [facility]  [phone] [facility]  [phone] [facility]  [phone] [facility]  [phone] Planned [facility]  [phone] [facility]  [phone]  Medicaid-accepting [facility] Providers: [facility]- [address]  [phone], Mon-Fri 9am-7pm, Sat 9am-1pm [facility]- [address] [place], Suite [place]  [phone] [facility]- [address] [place]  [phone] [facility]- [address]  [phone] Renaye Rakers- [address] [place], [address], [phone]  Only accepts [place] patients after they have their name  applied to their card  Self Pay (no insurance) in [facility]: Sickle Cell Patients: Dr Willey Blade, [facility]  [address] [place], [phone] [facility]- [address] [place]  [phone]       [facility] [place]- [address]       -     [facility]- see information above (Speak to [employer] if you do not have insurance)       -  [facility]- [address] [place], [phone]       -  [facility]- [address],  [phone]       -  [facility]- [address], [phone]       -  Dr Julio Sickssei-Bonsu-  [address], [address], [place], [phone]       -  [facility]- [address], [phone]       -  [facility]- [address] [place] [address], [phone], also [address], [phone]       -    [facility]- [address], [phone], 1st & 3rd Saturday   every month, 10am-1pm  1) Find a Doctor and Pay Out of Pocket Although you won't have to find out who is covered by your insurance plan, it is a good idea to ask around and get  recommendations. You will then need to call the office and see if the doctor you have chosen will accept you as a new patient and what types of options they offer for patients who are self-pay. Some doctors offer discounts or will set up payment plans for their patients who do not have insurance, but you will need to ask so you aren't surprised when you get to your appointment.  2) Contact Your Local Health Department Not all health departments have doctors that can see patients for sick visits, but many do, so it is worth a call to see if yours does. If you don't know where your local health department is, you can check in your phone book. The CDC also has a tool to help you locate your state's health department, and many state websites also have listings of all of their local health departments.  3) Find a Walk-in Clinic If your illness is not likely to be very severe or complicated, you may want to try a walk in clinic. These are popping up all over the country in pharmacies, drugstores, and shopping centers. They're usually staffed by nurse practitioners or physician assistants that have been trained to treat common illnesses and complaints. They're usually fairly quick and inexpensive. However, if you have serious medical issues or chronic medical problems, these are probably not your best option  STD Testing [facility] of [facility] [place], STD Clinic, [address], [place], phone [phone] or [phone].  Monday - Friday, call for an appointment. [facility] of [facility] [place], STD Clinic, [place], [place], phone [phone] or [phone].  Monday - Friday, call for an appointment.  Abuse/Neglect: [facility] Child Abuse Hotline (609)564-3506(336) 873-307-7623 [facility] Child Abuse Hotline 410-485-16169018766507 (After Hours)  Emergency Shelter:  [facility] 208-041-1639([phone]) [phone]  Maternity Homes: Room at the [place] [facility]  [phone](336) [phone] [facility] [phone]  MRSA Hotline #:   5032045583603-701-9257  [facility] Resources  [facility] [place]  United Way [facility]. [address]                 [address]         [address] [place] [address]  [place]                                               [place]obal [place]                              [place] Phone:  [phone]  Phone:  [phone]                   Phone:  [phone]  [facility], [phone] [facility] [facility] - [facility]- [phone]       -     [facility] in [place], [address],                                  [phone], [facility]  [place] Child Abuse Hotline 442-223-6987(336) (828)729-8484 or 712-597-2723(336) 413-885-6235 (After Hours)   Behavioral Health Services  Substance Abuse Resources: [facility] Drug Services  [phone] [facility] [phone] [facility] [phone] [facility] [phone] Residential & Outpatient Substance Abuse Program  [phone]  Psychological Services: [facility]  [phone] [facility]  [phone] [facility], [phone] [place] [address], [place], ACCESS LINE: [phone] or (520) [phone], [email or web address]  Dental Assistance  If unable to pay or uninsured, contact:  [facility] or [facility] to become qualified for the adult dental clinic.  Patients with Medicaid: [facility] [phone], [phone] [address], [phone]  If unable to pay, or uninsured, contact [facility] ([phone]) or [facility] [phone] in [place], [phone] in [place]) to become qualified for the adult dental clinic   Other Low-Cost Community Dental Services: [facility]- [address], [place], [place], [phone],  [phone], [address]. [address], 2nd and 4th Thursday of the month at 6:30am.  10 clients each day by appointment, can sometimes see walk-in patients if someone does not show for an appointment. [facility]- [address], [place], [place], [phone], [phone] [facility]- [address], [place], [place], [phone], [phone] [facility]- [phone] [facility]- [phone] [facility] [phone]- [phone]

## 2020-06-24 ENCOUNTER — Encounter (HOSPITAL_COMMUNITY): Payer: Self-pay | Admitting: Emergency Medicine

## 2020-06-24 ENCOUNTER — Other Ambulatory Visit: Payer: Self-pay

## 2020-06-24 ENCOUNTER — Emergency Department (HOSPITAL_COMMUNITY)
Admission: EM | Admit: 2020-06-24 | Discharge: 2020-06-24 | Disposition: A | Discharge status: Home / Self Care | Payer: Self-pay | Attending: Emergency Medicine | Admitting: Emergency Medicine

## 2020-06-24 DIAGNOSIS — F17210 Nicotine dependence, cigarettes, uncomplicated: Secondary | ICD-10-CM | POA: Insufficient documentation

## 2020-06-24 DIAGNOSIS — W25XXXA Contact with sharp glass, initial encounter: Secondary | ICD-10-CM | POA: Insufficient documentation

## 2020-06-24 DIAGNOSIS — Z23 Encounter for immunization: Secondary | ICD-10-CM | POA: Insufficient documentation

## 2020-06-24 DIAGNOSIS — J45909 Unspecified asthma, uncomplicated: Secondary | ICD-10-CM | POA: Insufficient documentation

## 2020-06-24 DIAGNOSIS — S61412A Laceration without foreign body of left hand, initial encounter: Secondary | ICD-10-CM | POA: Insufficient documentation

## 2020-06-24 MED ORDER — TETANUS-DIPHTH-ACELL PERTUSSIS 5-2.5-18.5 LF-MCG/0.5 IM SUSY
0.5000 mL | PREFILLED_SYRINGE | Freq: Once | INTRAMUSCULAR | Status: AC
  Administered 2020-06-24: 0.5 mL via INTRAMUSCULAR
  Filled 2020-06-24: qty 0.5

## 2020-06-24 NOTE — ED Triage Notes (Signed)
Pt presents with a L hand laceration. She was drinking out of a glass and it broke in her hand. Bleeding controlled.

## 2020-06-24 NOTE — ED Provider Notes (Signed)
[facility]-EMERGENCY DEPT Provider Note   CSN: [id number] Arrival date & time: 06/24/20  2205     History Chief Complaint  Patient presents with  . Laceration    Catherine Diaz is a 39 y.o. female.  The history is provided by the patient and medical records.    39 y.o. F with hx of asthma, presenting to the ED with laceration to left hand.  Patient states she was drinking out of a cup in her room when it broke in her hand.  No other injuries.  Tetanus needs to be updated.  Past Medical History:  Diagnosis Date  . Asthma     Patient Active Problem List   Diagnosis Date Noted  . Breast lump on left side at 10 o'clock position 03/29/2013  . Breast lump on right side at 8 o'clock position 03/29/2013  . Tobacco use 09/07/2012  . Sinusitis 09/07/2012  . History of abnormal Pap smear 09/07/2012  . ASTHMA 04/15/2007    Past Surgical History:  Procedure Laterality Date  . CERVICAL BIOPSY  W/ LOOP ELECTRODE EXCISION    . COLPOSCOPY    . KNEE SURGERY    . leep surgery 2009       OB History    Gravida  0   Para      Term      Preterm      AB      Living        SAB      TAB      Ectopic      Multiple      Live Births              Family History  Adopted: Yes    Social History   Tobacco Use  . Smoking status: Current Every Day Smoker    Types: Cigarettes  . Tobacco comment: per patient 2 cig a day   Substance Use Topics  . Alcohol use: Yes    Comment: social - 2 drinks once per week  . Drug use: No    Home Medications Prior to Admission medications   Medication Sig Start Date End Date Taking? Authorizing Provider  aspirin-acetaminophen-caffeine (EXCEDRIN MIGRAINE) [phone] MG tablet Take 2 tablets by mouth every 6 (six) hours as needed for headache.    [provider]  Ibuprofen-Diphenhydramine HCl (ADVIL PM) 200-25 MG CAPS Take 2 tablets by mouth at bedtime as needed (sleep).    [provider]   LORazepam (ATIVAN) 1 MG tablet Take 1 tablet (1 mg total) by mouth 3 (three) times daily as needed for anxiety. 02/16/16   Vanetta Mulders, MD  methocarbamol (ROBAXIN) 500 MG tablet Take 1 tablet (500 mg total) by mouth 2 (two) times daily. 04/01/18   Harlene Salts A, PA-C  naproxen (NAPROSYN) 500 MG tablet Take 1 tablet (500 mg total) by mouth 2 (two) times daily. Patient not taking: Reported on 02/16/2016 12/20/15   Fayrene Helper, PA-C    Allergies    Latex  Review of Systems   Review of Systems  Skin: Positive for wound.  All other systems reviewed and are negative.   Physical Exam Updated Vital Signs BP (!) 136/99 (BP Location: Left Arm)   Pulse 87   Temp 98.4 F (36.9 C) (Oral)   Resp 18   SpO2 100%   Physical Exam Vitals and nursing note reviewed.  Constitutional:      Appearance: She is well-developed.  HENT:  Head: Normocephalic and atraumatic.  Eyes:     Conjunctiva/sclera: Conjunctivae normal.     Pupils: Pupils are equal, round, and reactive to light.  Cardiovascular:     Rate and Rhythm: Normal rate and regular rhythm.     Heart sounds: Normal heart sounds.  Pulmonary:     Effort: Pulmonary effort is normal. No respiratory distress.     Breath sounds: Normal breath sounds. No rhonchi.  Abdominal:     General: Bowel sounds are normal.     Palpations: Abdomen is soft.  Musculoskeletal:        General: Normal range of motion.       Hands:     Cervical back: Normal range of motion.     Comments: Very superficial 1cm laceration noted to webbed space between left thumb and index finger, no active bleeding  Skin:    General: Skin is warm and dry.  Neurological:     Mental Status: She is alert and oriented to person, place, and time.     ED Results / Procedures / Treatments   Labs (all labs ordered are listed, but only abnormal results are displayed) Labs Reviewed - No data to display  EKG None  Radiology No results found.  Procedures Procedures  (including critical care time)  Medications Ordered in ED Medications  Tdap (BOOSTRIX) injection 0.5 mL (has no administration in time range)    ED Course  I have reviewed the triage vital signs and the nursing notes.  Pertinent labs & imaging results that were available during my care of the patient were reviewed by me and considered in my medical decision making (see chart for details).    MDM Rules/Calculators/A&P  39 y.o. F here with superficial laceration to webbed space of left hand between thumb and index finger.  1cm in length, very superficial, no active bleeding.  No bony deformities.  Tetanus updated.  Given superficial nature, repaired with dermabond.  Instructed on home wound care.  Close follow-up with PCP.  Return here for new concerns.  Final Clinical Impression(s) / ED Diagnoses Final diagnoses:  Laceration of left hand without foreign body, initial encounter    Rx / DC Orders ED Discharge Orders    None       Garlon Hatchet, PA-C 06/24/20 2343    Molpus, Jonny Ruiz, MD 06/25/20 [phone]

## 2020-06-24 NOTE — Discharge Instructions (Signed)
Dermabond will slough off over the next few days.  Try not to pull it off. Can wash hands and bathe as normal. Return here for new concerns.

## 2020-12-10 ENCOUNTER — Emergency Department (HOSPITAL_COMMUNITY)
Admission: EM | Admit: 2020-12-10 | Discharge: 2020-12-10 | Disposition: A | Discharge status: Home / Self Care | Payer: Self-pay | Attending: Emergency Medicine | Admitting: Emergency Medicine

## 2020-12-10 ENCOUNTER — Other Ambulatory Visit: Payer: Self-pay

## 2020-12-10 ENCOUNTER — Encounter (HOSPITAL_COMMUNITY): Payer: Self-pay

## 2020-12-10 DIAGNOSIS — Z91040 Latex allergy status: Secondary | ICD-10-CM | POA: Insufficient documentation

## 2020-12-10 DIAGNOSIS — J45909 Unspecified asthma, uncomplicated: Secondary | ICD-10-CM | POA: Insufficient documentation

## 2020-12-10 DIAGNOSIS — Z7982 Long term (current) use of aspirin: Secondary | ICD-10-CM | POA: Insufficient documentation

## 2020-12-10 DIAGNOSIS — X102XXA Contact with fats and cooking oils, initial encounter: Secondary | ICD-10-CM | POA: Insufficient documentation

## 2020-12-10 DIAGNOSIS — F17210 Nicotine dependence, cigarettes, uncomplicated: Secondary | ICD-10-CM | POA: Insufficient documentation

## 2020-12-10 DIAGNOSIS — T23221A Burn of second degree of single right finger (nail) except thumb, initial encounter: Secondary | ICD-10-CM

## 2020-12-10 DIAGNOSIS — T23201A Burn of second degree of right hand, unspecified site, initial encounter: Secondary | ICD-10-CM | POA: Insufficient documentation

## 2020-12-10 DIAGNOSIS — T23151A Burn of first degree of right palm, initial encounter: Secondary | ICD-10-CM

## 2020-12-10 MED ORDER — BACITRACIN ZINC 500 UNIT/GM EX OINT
TOPICAL_OINTMENT | Freq: Two times a day (BID) | CUTANEOUS | Status: DC
  Filled 2020-12-10: qty 0.9
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
## 2021-11-18 ENCOUNTER — Ambulatory Visit: Payer: BLUE CROSS/BLUE SHIELD | Admitting: Family

## 2022-04-15 ENCOUNTER — Emergency Department
Admission: EM | Admit: 2022-04-15 | Discharge: 2022-04-15 | Discharge status: Against Medical Advice | Payer: Self-pay | Attending: Emergency Medicine | Admitting: Emergency Medicine

## 2022-04-15 DIAGNOSIS — Z5321 Procedure and treatment not carried out due to patient leaving prior to being seen by health care provider: Secondary | ICD-10-CM | POA: Insufficient documentation

## 2022-04-15 DIAGNOSIS — M545 Low back pain, unspecified: Secondary | ICD-10-CM | POA: Insufficient documentation

## 2022-04-15 NOTE — ED Provider Notes (Signed)
   [facility] Provider Note  Patient Contact: 9:19 PM (approximate)   History   Back Pain (Patient is here for back pain that began after she bent over to pick up a flower pot; States that the pain begins in her lower back and wraps around her LEFT thigh and stops at her knee; Is able to ambulate (awkwardly); No loss of bowel/bladder; No numbness/tingling to toes; Took Ibuprofen 800 mg PTA)   HPI  Catherine Diaz is a 41 y.o. female who presents to the emergency department complaining of lower back pain radiating into the left leg.  Patient states that she was bending over to pick up a flower pot when she felt pain in her back.  No loss of bowel or bladder function, no saddle anesthesia no paresthesias.  Patient has not tried any medications.  No chronic back pains.     Physical Exam   Triage Vital Signs: ED Triage Vitals  Enc Vitals Group     BP 04/15/22 1759 (!) 136/96     Pulse Rate 04/15/22 1759 83     Resp 04/15/22 1759 19     Temp 04/15/22 1759 98.6 F (37 C)     Temp Source 04/15/22 1759 Oral     SpO2 04/15/22 1759 95 %     Weight --      Height --      Head Circumference --      Peak Flow --      Pain Score 04/15/22 1758 9     Pain Loc --      Pain Edu? --      Excl. in GC? --     Most recent vital signs: Vitals:   04/15/22 1759  BP: (!) 136/96  Pulse: 83  Resp: 19  Temp: 98.6 F (37 C)  SpO2: 95%     General: Alert and in no acute distress.  Cardiovascular:  Good peripheral perfusion Respiratory: Normal respiratory effort without tachypnea or retractions. Lungs CTAB.  Musculoskeletal: Full range of motion to all extremities.  The signs of trauma to the lumbar spine, tenderness along the left paraspinal muscles extending into the SI region.  Pulses sensation intact distally. Neurologic:  No gross focal neurologic deficits are appreciated.  Skin:   No rash noted Other:   ED Results / Procedures / Treatments   Labs (all labs  ordered are listed, but only abnormal results are displayed) Labs Reviewed - No data to display   EKG     RADIOLOGY    No results found.  PROCEDURES:  Critical Care performed: No  Procedures   MEDICATIONS ORDERED IN ED: Medications - No data to display   IMPRESSION / MDM / ASSESSMENT AND PLAN / ED COURSE  I reviewed the triage vital signs and the nursing notes.                              Patient eloped from the ED.  Patient was seen, imaging was to be ordered when patient eloped from the room without informing myself or staff.  Final diagnosis is not communicated to the patient.  There was no treatments administered or prescribed.    Note:  This document was prepared using Dragon voice recognition software and may include unintentional dictation errors.   Lanette Hampshire 04/15/22 2221    Shaune Pollack, MD 04/15/22 [phone]

## 2022-04-15 NOTE — ED Triage Notes (Signed)
Patient is here for back pain that began after she bent over to pick up a flower pot; States that the pain begins in her lower back and wraps around her LEFT thigh and stops at her knee; Is able to ambulate (awkwardly); No loss of bowel/bladder; No numbness/tingling to toes; Took Ibuprofen 800 mg PTA

## 2022-06-05 ENCOUNTER — Emergency Department (HOSPITAL_COMMUNITY)
Admission: EM | Admit: 2022-06-05 | Discharge: 2022-06-05 | Discharge status: Against Medical Advice | Payer: Self-pay | Attending: Emergency Medicine | Admitting: Emergency Medicine

## 2022-06-05 ENCOUNTER — Encounter (HOSPITAL_COMMUNITY): Payer: Self-pay | Admitting: Emergency Medicine

## 2022-06-05 ENCOUNTER — Emergency Department (HOSPITAL_COMMUNITY): Payer: Self-pay

## 2022-06-05 ENCOUNTER — Other Ambulatory Visit: Payer: Self-pay

## 2022-06-05 DIAGNOSIS — R1110 Vomiting, unspecified: Secondary | ICD-10-CM | POA: Insufficient documentation

## 2022-06-05 DIAGNOSIS — R079 Chest pain, unspecified: Secondary | ICD-10-CM | POA: Insufficient documentation

## 2022-06-05 DIAGNOSIS — R1013 Epigastric pain: Secondary | ICD-10-CM | POA: Insufficient documentation

## 2022-06-05 DIAGNOSIS — R197 Diarrhea, unspecified: Secondary | ICD-10-CM | POA: Insufficient documentation

## 2022-06-05 DIAGNOSIS — Z5321 Procedure and treatment not carried out due to patient leaving prior to being seen by health care provider: Secondary | ICD-10-CM | POA: Insufficient documentation

## 2022-06-05 LAB — TROPONIN I (HIGH SENSITIVITY): Troponin I (High Sensitivity): 4 ng/L (ref <18)

## 2022-06-05 LAB — CBC WITH DIFFERENTIAL/PLATELET
Abs Immature Granulocytes: 0.03 10*3/uL (ref 0.00–0.07)
Basophils Absolute: 0.1 10*3/uL (ref 0.0–0.1)
Basophils Relative: 1 %
Eosinophils Absolute: 0.2 10*3/uL (ref 0.0–0.5)
Eosinophils Relative: 3 %
HCT: 43.6 % (ref 36.0–46.0)
Hemoglobin: 14.6 g/dL (ref 12.0–15.0)
Immature Granulocytes: 1 %
Lymphocytes Relative: 36 %
Lymphs Abs: 2.3 10*3/uL (ref 0.7–4.0)
MCH: 30 pg (ref 26.0–34.0)
MCHC: 33.5 g/dL (ref 30.0–36.0)
MCV: 89.7 fL (ref 80.0–100.0)
Monocytes Absolute: 0.6 10*3/uL (ref 0.1–1.0)
Monocytes Relative: 10 %
Neutro Abs: 3.1 10*3/uL (ref 1.7–7.7)
Neutrophils Relative %: 49 %
Platelets: 324 10*3/uL (ref 150–400)
RBC: 4.86 MIL/uL (ref 3.87–5.11)
RDW: 13.6 % (ref 11.5–15.5)
WBC: 6.3 10*3/uL (ref 4.0–10.5)
nRBC: 0 % (ref 0.0–0.2)

## 2022-06-05 LAB — COMPREHENSIVE METABOLIC PANEL
ALT: 165 U/L — ABNORMAL HIGH (ref 0–44)
AST: 96 U/L — ABNORMAL HIGH (ref 15–41)
Albumin: 4.5 g/dL (ref 3.5–5.0)
Alkaline Phosphatase: 84 U/L (ref 38–126)
Anion gap: 13 (ref 5–15)
BUN: 14 mg/dL (ref 6–20)
CO2: 24 mmol/L (ref 22–32)
Calcium: 9.8 mg/dL (ref 8.9–10.3)
Chloride: 104 mmol/L (ref 98–111)
Creatinine, Ser: 1.08 mg/dL — ABNORMAL HIGH (ref 0.44–1.00)
GFR, Estimated: >60 mL/min (ref >60)
Glucose, Bld: 120 mg/dL — ABNORMAL HIGH (ref 70–99)
Potassium: 4.2 mmol/L (ref 3.5–5.1)
Sodium: 141 mmol/L (ref 135–145)
Total Bilirubin: 0.5 mg/dL (ref 0.3–1.2)
Total Protein: 7.8 g/dL (ref 6.5–8.1)

## 2022-06-05 LAB — I-STAT BETA HCG BLOOD, ED (MC, WL, AP ONLY): I-stat hCG, quantitative: <5 m[IU]/mL (ref <5)

## 2022-06-05 LAB — LIPASE, BLOOD: Lipase: 37 U/L (ref 11–51)

## 2022-06-05 MED ORDER — ONDANSETRON 4 MG PO TBDP: 4.0000 mg | ORAL_TABLET | Freq: Once | ORAL | Status: DC

## 2022-06-05 NOTE — ED Triage Notes (Signed)
Pt c/o chest pain that radiates to her back, reports it woke her from her sleep. Pt c/o bloody diarrhea and emesis.

## 2022-06-05 NOTE — ED Provider Triage Note (Signed)
Emergency Medicine Provider Triage Evaluation Note  Catherine Diaz , a 41 y.o. female  was evaluated in triage.  Pt complains of chest pain, vomiting and diarrhea.  Patient reports that for the past 2 weeks she has been experiencing chest pain intermittently.  She has a very physical job, she reports is worse with movement.  That she had been dealing with but this morning she woke up with sudden diarrhea and had multiple episodes of vomiting, initially had a few small streaks of blood in her vomit, no bloody emesis here in the emergency department.  She reports some associated epigastric pain.  This scared her and caused her to come in.  She denies significant change to the chest pain she has been experiencing..  Review of Systems  Positive: Chest pain, epigastric pain, vomiting, diarrhea Negative: Fevers, chills, syncope  Physical Exam  BP (!) 145/102 (BP Location: Right Arm)   Pulse 86   Temp (!) 97.4 F (36.3 C) (Oral)   Resp 18   SpO2 100%  Gen:   Awake, no distress   Resp:  Normal effort, mild tenderness to the anterior chest wall without palpable deformity, RRR, CTA bilateral MSK:   Moves extremities without difficulty  Other:  Epigastric tenderness  Medical Decision Making  Medically screening exam initiated at 7:00 AM.  Appropriate orders placed.  Catherine Diaz was informed that the remainder of the evaluation will be completed by another provider, this initial triage assessment does not replace that evaluation, and the importance of remaining in the ED until their evaluation is complete.     Jacqlyn Larsen, [place] 06/05/22 0710

## 2022-08-13 ENCOUNTER — Emergency Department (HOSPITAL_COMMUNITY)
Admission: EM | Admit: 2022-08-13 | Discharge: 2022-08-14 | Discharge status: Against Medical Advice | Payer: BLUE CROSS/BLUE SHIELD | Attending: Emergency Medicine | Admitting: Emergency Medicine

## 2022-08-13 ENCOUNTER — Emergency Department (HOSPITAL_COMMUNITY): Payer: BLUE CROSS/BLUE SHIELD

## 2022-08-13 ENCOUNTER — Other Ambulatory Visit: Payer: Self-pay

## 2022-08-13 ENCOUNTER — Encounter (HOSPITAL_COMMUNITY): Payer: Self-pay | Admitting: Emergency Medicine

## 2022-08-13 DIAGNOSIS — W109XXA Fall (on) (from) unspecified stairs and steps, initial encounter: Secondary | ICD-10-CM | POA: Diagnosis not present

## 2022-08-13 DIAGNOSIS — R109 Unspecified abdominal pain: Secondary | ICD-10-CM | POA: Diagnosis not present

## 2022-08-13 DIAGNOSIS — M25512 Pain in left shoulder: Secondary | ICD-10-CM | POA: Diagnosis not present

## 2022-08-13 DIAGNOSIS — Z5321 Procedure and treatment not carried out due to patient leaving prior to being seen by health care provider: Secondary | ICD-10-CM | POA: Diagnosis not present

## 2022-08-13 DIAGNOSIS — R0782 Intercostal pain: Secondary | ICD-10-CM | POA: Diagnosis not present

## 2022-08-13 LAB — CBC WITH DIFFERENTIAL/PLATELET
Abs Immature Granulocytes: 0.01 10*3/uL (ref 0.00–0.07)
Basophils Absolute: 0.1 10*3/uL (ref 0.0–0.1)
Basophils Relative: 2 %
Eosinophils Absolute: 0.1 10*3/uL (ref 0.0–0.5)
Eosinophils Relative: 3 %
HCT: 40.7 % (ref 36.0–46.0)
Hemoglobin: 13.5 g/dL (ref 12.0–15.0)
Immature Granulocytes: 0 %
Lymphocytes Relative: 61 %
Lymphs Abs: 3.2 10*3/uL (ref 0.7–4.0)
MCH: 29.9 pg (ref 26.0–34.0)
MCHC: 33.2 g/dL (ref 30.0–36.0)
MCV: 90 fL (ref 80.0–100.0)
Monocytes Absolute: 0.6 10*3/uL (ref 0.1–1.0)
Monocytes Relative: 12 %
Neutro Abs: 1.2 10*3/uL — ABNORMAL LOW (ref 1.7–7.7)
Neutrophils Relative %: 22 %
Platelets: 227 10*3/uL (ref 150–400)
RBC: 4.52 MIL/uL (ref 3.87–5.11)
RDW: 13.2 % (ref 11.5–15.5)
WBC: 5.2 10*3/uL (ref 4.0–10.5)
nRBC: 0 % (ref 0.0–0.2)

## 2022-08-13 LAB — COMPREHENSIVE METABOLIC PANEL
ALT: 348 U/L — ABNORMAL HIGH (ref 0–44)
AST: 241 U/L — ABNORMAL HIGH (ref 15–41)
Albumin: 3.7 g/dL (ref 3.5–5.0)
Alkaline Phosphatase: 101 U/L (ref 38–126)
Anion gap: 11 (ref 5–15)
BUN: 15 mg/dL (ref 6–20)
CO2: 23 mmol/L (ref 22–32)
Calcium: 8.9 mg/dL (ref 8.9–10.3)
Chloride: 103 mmol/L (ref 98–111)
Creatinine, Ser: 0.96 mg/dL (ref 0.44–1.00)
GFR, Estimated: >60 mL/min (ref >60)
Glucose, Bld: 183 mg/dL — ABNORMAL HIGH (ref 70–99)
Potassium: 4.4 mmol/L (ref 3.5–5.1)
Sodium: 137 mmol/L (ref 135–145)
Total Bilirubin: 0.3 mg/dL (ref 0.3–1.2)
Total Protein: 6.9 g/dL (ref 6.5–8.1)

## 2022-08-13 LAB — I-STAT CHEM 8, ED
BUN: 18 mg/dL (ref 6–20)
Calcium, Ion: 1.13 mmol/L — ABNORMAL LOW (ref 1.15–1.40)
Chloride: 103 mmol/L (ref 98–111)
Creatinine, Ser: 0.9 mg/dL (ref 0.44–1.00)
Glucose, Bld: 176 mg/dL — ABNORMAL HIGH (ref 70–99)
HCT: 43 % (ref 36.0–46.0)
Hemoglobin: 14.6 g/dL (ref 12.0–15.0)
Potassium: 4.2 mmol/L (ref 3.5–5.1)
Sodium: 140 mmol/L (ref 135–145)
TCO2: 26 mmol/L (ref 22–32)

## 2022-08-13 LAB — I-STAT BETA HCG BLOOD, ED (MC, WL, AP ONLY): I-stat hCG, quantitative: <5 m[IU]/mL (ref <5)

## 2022-08-13 MED ORDER — IOHEXOL 350 MG/ML SOLN
75.0000 mL | Freq: Once | INTRAVENOUS | Status: AC | PRN
  Administered 2022-08-13: 75 mL via INTRAVENOUS

## 2022-08-13 NOTE — ED Provider Triage Note (Signed)
Emergency Medicine Provider Triage Evaluation Note  Deshauna Cayson , a 42 y.o. female  was evaluated in triage.  Pt complains of fell down 4-5 steps after she slipped on wood complains of pain to her left ribs, left abdomen and left shoulder.  Review of Systems  Positive: Abdominal pain, shoulder pain, shortness of breath Negative: Vomiting  Physical Exam  BP (!) 138/95 (BP Location: Left Arm)   Pulse (!) 102   Temp 98.9 F (37.2 C)   Resp 18   SpO2 98%  Gen:   Awake, no distress    Resp:  Normal effort   MSK:   Moves extremities without difficulty pain to left shoulder, pain to the mid thoracic spine, left rib cage Other:  Pain to the left upper abdomen  Medical Decision Making  Medically screening exam initiated at 8:29 AM.  Appropriate orders placed.  Prarthana Parlin was informed that the remainder of the evaluation will be completed by another provider, this initial triage assessment does not replace that evaluation, and the importance of remaining in the ED until their evaluation is complete.      Malvin Johns, MD 08/13/22 [phone]

## 2022-08-13 NOTE — ED Triage Notes (Addendum)
PT reports left sided rib pain and left sided abdominal pain after falling down 4 stairs last night. Pt denies hitting head denies neck pain. PT denies SHOB.

## 2022-11-28 ENCOUNTER — Ambulatory Visit: Payer: BLUE CROSS/BLUE SHIELD | Admitting: Family Medicine

## 2023-06-07 ENCOUNTER — Encounter (HOSPITAL_COMMUNITY): Payer: Self-pay | Admitting: Pharmacy Technician

## 2023-06-07 ENCOUNTER — Emergency Department (HOSPITAL_COMMUNITY)
Admission: EM | Admit: 2023-06-07 | Discharge: 2023-06-07 | Disposition: A | Discharge status: Home / Self Care | Payer: BLUE CROSS/BLUE SHIELD | Attending: Emergency Medicine | Admitting: Emergency Medicine

## 2023-06-07 ENCOUNTER — Other Ambulatory Visit: Payer: Self-pay

## 2023-06-07 DIAGNOSIS — Z1152 Encounter for screening for COVID-19: Secondary | ICD-10-CM | POA: Insufficient documentation

## 2023-06-07 DIAGNOSIS — J039 Acute tonsillitis, unspecified: Secondary | ICD-10-CM

## 2023-06-07 DIAGNOSIS — J029 Acute pharyngitis, unspecified: Secondary | ICD-10-CM

## 2023-06-07 LAB — GROUP A STREP BY PCR: Group A Strep by PCR: NOT DETECTED

## 2023-06-07 LAB — SARS CORONAVIRUS 2 BY RT PCR: SARS Coronavirus 2 by RT PCR: NEGATIVE

## 2023-06-07 MED ORDER — CLINDAMYCIN HCL 300 MG PO CAPS: 300.0000 mg | ORAL_CAPSULE | Freq: Four times a day (QID) | ORAL | 0 refills | Status: AC

## 2023-06-07 MED ORDER — IBUPROFEN 800 MG PO TABS
800.0000 mg | ORAL_TABLET | Freq: Once | ORAL | Status: AC
  Administered 2023-06-07: 800 mg via ORAL
  Filled 2023-06-07: qty 1

## 2023-06-07 MED ORDER — IBUPROFEN 800 MG PO TABS: 800.0000 mg | ORAL_TABLET | Freq: Three times a day (TID) | ORAL | 0 refills | Status: AC | PRN

## 2023-06-07 NOTE — ED Provider Notes (Signed)
[place] EMERGENCY DEPARTMENT AT [facility] Provider Note   CSN: [id number] Arrival date & time: 06/07/23  1607     History  Chief Complaint  Patient presents with   Sore Throat   Generalized Body Aches    Catherine Diaz is a 42 y.o. female.  Patient complains of a sore throat for the past 3 days.  Patient reports swelling to her tonsils.  She has had strep in the past and reports that this feels the same.  Patient denies current fever or chills.  She has had some bodyaches.  Patient denies any cough or congestion.  She is not having any difficulty swallowing.  The history is provided by the patient. No language interpreter was used.  Sore Throat       Home Medications Prior to Admission medications   Medication Sig Start Date End Date Taking? Authorizing Provider  clindamycin (CLEOCIN) 300 MG capsule Take 1 capsule (300 mg total) by mouth 4 (four) times daily for 10 days. 06/07/23 06/17/23 Yes Elson Areas, PA-C  ibuprofen (ADVIL) 800 MG tablet Take 1 tablet (800 mg total) by mouth every 8 (eight) hours as needed. 06/07/23  Yes Elson Areas, PA-C      Allergies    Latex    Review of Systems   Review of Systems  All other systems reviewed and are negative.   Physical Exam Updated Vital Signs BP (!) 141/101 (BP Location: Right Arm)   Pulse 99   Temp 99 F (37.2 C) (Axillary)   Resp 15   SpO2 99%  Physical Exam Vitals and nursing note reviewed.  Constitutional:      Appearance: She is well-developed.  HENT:     Head: Normocephalic.     Nose: No rhinorrhea.     Mouth/Throat:     Mouth: Mucous membranes are moist.     Tonsils: Tonsillar exudate present.     Comments: Bilateral tonsils swollen, right tonsil slightly enlarged compared to left, no trismus, no palate edema.  Uvula is midline Cardiovascular:     Rate and Rhythm: Normal rate.  Pulmonary:     Effort: Pulmonary effort is normal.  Abdominal:     General: There is no  distension.  Musculoskeletal:        General: Normal range of motion.     Cervical back: Normal range of motion.  Skin:    General: Skin is warm.  Neurological:     General: No focal deficit present.     Mental Status: She is alert and oriented to person, place, and time.     ED Results / Procedures / Treatments   Labs (all labs ordered are listed, but only abnormal results are displayed) Labs Reviewed  GROUP A STREP BY PCR  SARS CORONAVIRUS 2 BY RT PCR    EKG None  Radiology No results found.  Procedures Procedures    Medications Ordered in ED Medications  ibuprofen (ADVIL) tablet 800 mg (800 mg Oral Given 06/07/23 1140)    ED Course/ Medical Decision Making/ A&P                                 Medical Decision Making Patient complains of a sore throat she is worried that she has strep  Amount and/or Complexity of Data Reviewed Labs: ordered. Decision-making details documented in ED Course.    Details: Strep screen is negative, COVID is negative.  Risk Prescription drug management. Risk Details: Patient has a exudative tonsillitis with swelling right greater than left.  Patient does not have any uvula deviation or palate edema.  I will treat the patient for clindamycin.  Patient is advised that if she has increasing swelling difficulty swallowing or shortness of breath she should to return to the emergency department.  I considered the possibility of early peritonsillar abscess.   I counseled paper patient on possibility and need for follow-up.           Final Clinical Impression(s) / ED Diagnoses Final diagnoses:  Pharyngitis, unspecified etiology  Tonsillitis    Rx / DC Orders ED Discharge Orders          Ordered    clindamycin (CLEOCIN) 300 MG capsule  4 times daily        06/07/23 1212    ibuprofen (ADVIL) 800 MG tablet  Every 8 hours PRN        06/07/23 1212           An After Visit Summary was printed and given to the patient.     Osie Cheeks 06/07/23 1241    Linwood Dibbles, MD 06/09/23 [phone]

## 2023-06-07 NOTE — Discharge Instructions (Addendum)
Return if any problems.

## 2023-06-07 NOTE — ED Triage Notes (Signed)
Pt states has had a sore throat for the last 3 days. Yesterday with fever, chills and generalized body aches.

## 2023-11-06 ENCOUNTER — Encounter (HOSPITAL_BASED_OUTPATIENT_CLINIC_OR_DEPARTMENT_OTHER): Payer: Self-pay | Admitting: Emergency Medicine

## 2023-11-06 ENCOUNTER — Other Ambulatory Visit: Payer: Self-pay

## 2023-11-06 ENCOUNTER — Emergency Department (HOSPITAL_BASED_OUTPATIENT_CLINIC_OR_DEPARTMENT_OTHER)
Admission: EM | Admit: 2023-11-06 | Discharge: 2023-11-06 | Disposition: A | Discharge status: Home / Self Care | Attending: Emergency Medicine | Admitting: Emergency Medicine

## 2023-11-06 ENCOUNTER — Emergency Department (HOSPITAL_BASED_OUTPATIENT_CLINIC_OR_DEPARTMENT_OTHER): Admitting: Radiology

## 2023-11-06 DIAGNOSIS — S39012A Strain of muscle, fascia and tendon of lower back, initial encounter: Secondary | ICD-10-CM | POA: Insufficient documentation

## 2023-11-06 DIAGNOSIS — X501XXA Overexertion from prolonged static or awkward postures, initial encounter: Secondary | ICD-10-CM | POA: Insufficient documentation

## 2023-11-06 DIAGNOSIS — Z91040 Latex allergy status: Secondary | ICD-10-CM | POA: Diagnosis not present

## 2023-11-06 DIAGNOSIS — M51369 Other intervertebral disc degeneration, lumbar region without mention of lumbar back pain or lower extremity pain: Secondary | ICD-10-CM | POA: Insufficient documentation

## 2023-11-06 DIAGNOSIS — M545 Low back pain, unspecified: Secondary | ICD-10-CM | POA: Diagnosis present

## 2023-11-06 LAB — URINALYSIS, ROUTINE W REFLEX MICROSCOPIC
Bacteria, UA: NONE SEEN
Bilirubin Urine: NEGATIVE
Glucose, UA: NEGATIVE mg/dL
Ketones, ur: NEGATIVE mg/dL
Leukocytes,Ua: NEGATIVE
Nitrite: NEGATIVE
Protein, ur: NEGATIVE mg/dL
Specific Gravity, Urine: 1.027 (ref 1.005–1.030)
pH: 5.5 (ref 5.0–8.0)

## 2023-11-06 LAB — PREGNANCY, URINE: Preg Test, Ur: NEGATIVE

## 2023-11-06 MED ORDER — KETOROLAC TROMETHAMINE 30 MG/ML IJ SOLN
30.0000 mg | Freq: Once | INTRAMUSCULAR | Status: AC
  Administered 2023-11-06: 30 mg via INTRAMUSCULAR
  Filled 2023-11-06: qty 1

## 2023-11-06 MED ORDER — NAPROXEN 500 MG PO TABS: 500.0000 mg | ORAL_TABLET | Freq: Two times a day (BID) | ORAL | 0 refills | Status: AC

## 2023-11-06 MED ORDER — CYCLOBENZAPRINE HCL 10 MG PO TABS: 10.0000 mg | ORAL_TABLET | Freq: Two times a day (BID) | ORAL | 0 refills | Status: AC | PRN

## 2023-11-06 NOTE — Discharge Instructions (Signed)
 Take the medications as prescribed to help with your back pain.  Consider following up with a orthopedic spine doctor for further evaluation

## 2023-11-06 NOTE — ED Notes (Signed)
 Reviewed AVS/discharge instruction with patient. Time allotted for and all questions answered. Patient is agreeable for d/c and escorted to ed exit by staff.

## 2023-11-06 NOTE — ED Provider Notes (Signed)
  EMERGENCY DEPARTMENT AT [facility] Provider Note   CSN: [id number] Arrival date & time: 11/06/23  1640     History  Chief Complaint  Patient presents with   Back Pain    Catherine Diaz is a 43 y.o. female.   Back Pain    Pt states she was twisting the other day and she felt a pop.  Pt has been trying home remedies without relief.  The pain is mostly in her lower back and more on the left side.  She has not had any numbness or weakness.  Pain increases when she is trying to sit stand or walk.  It is a bit better when she is lying down.  She denies any dysuria.  No fevers or chills.  She has had trouble with her back in the past.  Home Medications Prior to Admission medications   Medication Sig Start Date End Date Taking? Authorizing Provider  cyclobenzaprine (FLEXERIL) 10 MG tablet Take 1 tablet (10 mg total) by mouth 2 (two) times daily as needed for muscle spasms. 11/06/23  Yes Linwood Dibbles, MD  naproxen (NAPROSYN) 500 MG tablet Take 1 tablet (500 mg total) by mouth 2 (two) times daily. 11/06/23  Yes Linwood Dibbles, MD  ibuprofen (ADVIL) 800 MG tablet Take 1 tablet (800 mg total) by mouth every 8 (eight) hours as needed. 06/07/23   Elson Areas, PA-C      Allergies    Latex    Review of Systems   Review of Systems  Musculoskeletal:  Positive for back pain.    Physical Exam Updated Vital Signs BP (!) 126/105   Pulse 71   Temp 98.3 F (36.8 C)   Resp 18   SpO2 100%  Physical Exam Vitals and nursing note reviewed.  Constitutional:      General: She is not in acute distress.    Appearance: She is well-developed.  HENT:     Head: Normocephalic and atraumatic.     Right Ear: External ear normal.     Left Ear: External ear normal.  Eyes:     General: No scleral icterus.       Right eye: No discharge.        Left eye: No discharge.     Conjunctiva/sclera: Conjunctivae normal.  Neck:     Trachea: No tracheal deviation.  Cardiovascular:      Rate and Rhythm: Normal rate.  Pulmonary:     Effort: Pulmonary effort is normal. No respiratory distress.     Breath sounds: No stridor.  Abdominal:     General: There is no distension.  Musculoskeletal:        General: Tenderness present. No swelling or deformity.     Cervical back: Neck supple.     Comments: Tenderness palpation paraspinal region lumbar spine  Skin:    General: Skin is warm and dry.     Findings: No rash.  Neurological:     General: No focal deficit present.     Mental Status: She is alert. Mental status is at baseline.     Cranial Nerves: No dysarthria or facial asymmetry.     Sensory: No sensory deficit.     Motor: No weakness or seizure activity.     ED Results / Procedures / Treatments   Labs (all labs ordered are listed, but only abnormal results are displayed) Labs Reviewed  URINALYSIS, ROUTINE W REFLEX MICROSCOPIC - Abnormal; Notable for the following components:  Result Value   Hgb urine dipstick SMALL (*)    All other components within normal limits  PREGNANCY, URINE    EKG None  Radiology DG Lumbar Spine Complete Result Date: 11/06/2023 CLINICAL DATA:  Back pain EXAM: LUMBAR SPINE - COMPLETE 4+ VIEW COMPARISON:  04/01/2018 FINDINGS: Normal alignment. No acute fracture or listhesis of the lumbar spine. Mild intervertebral disc space narrowing and endplate remodeling at L3-4, L4-5, and L5-S1 in keeping with changes of degenerative disc disease, progressive since prior examination. Oblique views demonstrate no evidence of pars defect. IMPRESSION: 1. Mild degenerative disc disease, progressive since prior examination. Electronically Signed   By: Helyn Numbers M.D.   On: 11/06/2023 21:29    Procedures Procedures    Medications Ordered in ED Medications  ketorolac (TORADOL) 30 MG/ML injection 30 mg (has no administration in time range)    ED Course/ Medical Decision Making/ A&P Clinical Course as of 11/06/23 2208  Fri Nov 06, 2023   2041 Preg Test negative.  Urinalysis negative [JK]  2152 Lumbar spine shows mild degenerative changes.  Increasing since previous x-rays [JK]    Clinical Course User Index [JK] Linwood Dibbles, MD                                 Medical Decision Making Problems Addressed: Degeneration of intervertebral disc of lumbar region, unspecified whether pain present: acute illness or injury that poses a threat to life or bodily functions Strain of lumbar region, initial encounter: acute illness or injury that poses a threat to life or bodily functions  Amount and/or Complexity of Data Reviewed Labs: ordered. Decision-making details documented in ED Course. Radiology: ordered and independent interpretation performed.  Risk Prescription drug management.   Patient presented to the ED with complaints of back pain.  Patient does not have any radicular symptoms at this time.  Her x-rays do not show any signs of compression fracture.  She does have degenerative changes.  Will try her on a course of NSAIDs and muscle relaxants.  Recommend outpatient follow-up with orthopedic spine.        Final Clinical Impression(s) / ED Diagnoses Final diagnoses:  Strain of lumbar region, initial encounter  Degeneration of intervertebral disc of lumbar region, unspecified whether pain present    Rx / DC Orders ED Discharge Orders          Ordered    naproxen (NAPROSYN) 500 MG tablet  2 times daily        11/06/23 2206    cyclobenzaprine (FLEXERIL) 10 MG tablet  2 times daily PRN        11/06/23 2206              Linwood Dibbles, MD 11/06/23 2208

## 2023-11-06 NOTE — ED Triage Notes (Signed)
 Twisted wrong, felt a pop lowback left side pain into buttocks Started Tuesday, had similar last year. Tried OTC med, ice, heat,rest, bath, topical meds no relief Pain with sitting standing walking, better when laying down

## 2024-09-05 ENCOUNTER — Other Ambulatory Visit: Payer: Self-pay

## 2024-09-05 ENCOUNTER — Emergency Department (HOSPITAL_COMMUNITY)
Admission: EM | Admit: 2024-09-05 | Discharge: 2024-09-05 | Disposition: A | Discharge status: Home / Self Care | Attending: Emergency Medicine | Admitting: Emergency Medicine

## 2024-09-05 ENCOUNTER — Emergency Department (HOSPITAL_COMMUNITY)

## 2024-09-05 ENCOUNTER — Encounter (HOSPITAL_COMMUNITY): Payer: Self-pay | Admitting: Emergency Medicine

## 2024-09-05 DIAGNOSIS — R202 Paresthesia of skin: Secondary | ICD-10-CM | POA: Insufficient documentation

## 2024-09-05 DIAGNOSIS — I712 Thoracic aortic aneurysm, without rupture, unspecified: Secondary | ICD-10-CM | POA: Insufficient documentation

## 2024-09-05 DIAGNOSIS — F17210 Nicotine dependence, cigarettes, uncomplicated: Secondary | ICD-10-CM | POA: Insufficient documentation

## 2024-09-05 DIAGNOSIS — F419 Anxiety disorder, unspecified: Secondary | ICD-10-CM | POA: Insufficient documentation

## 2024-09-05 LAB — BASIC METABOLIC PANEL WITH GFR
Anion gap: 15 (ref 5–15)
BUN: 11 mg/dL (ref 6–20)
CO2: 25 mmol/L (ref 22–32)
Calcium: 9.9 mg/dL (ref 8.9–10.3)
Chloride: 100 mmol/L (ref 98–111)
Creatinine, Ser: 1.06 mg/dL — ABNORMAL HIGH (ref 0.44–1.00)
GFR, Estimated: >60 mL/min
Glucose, Bld: 146 mg/dL — ABNORMAL HIGH (ref 70–99)
Potassium: 4.3 mmol/L (ref 3.5–5.1)
Sodium: 140 mmol/L (ref 135–145)

## 2024-09-05 LAB — TROPONIN T, HIGH SENSITIVITY
Troponin T High Sensitivity: <6 ng/L (ref 0–19)
Troponin T High Sensitivity: <6 ng/L (ref 0–19)

## 2024-09-05 LAB — CBC
HCT: 46.7 % — ABNORMAL HIGH (ref 36.0–46.0)
Hemoglobin: 15.5 g/dL — ABNORMAL HIGH (ref 12.0–15.0)
MCH: 30.5 pg (ref 26.0–34.0)
MCHC: 33.2 g/dL (ref 30.0–36.0)
MCV: 91.7 fL (ref 80.0–100.0)
Platelets: 328 10*3/uL (ref 150–400)
RBC: 5.09 MIL/uL (ref 3.87–5.11)
RDW: 13.6 % (ref 11.5–15.5)
WBC: 5.6 10*3/uL (ref 4.0–10.5)
nRBC: 0 % (ref 0.0–0.2)

## 2024-09-05 LAB — HCG, SERUM, QUALITATIVE: Preg, Serum: NEGATIVE

## 2024-09-05 MED ORDER — IOHEXOL 350 MG/ML SOLN
100.0000 mL | Freq: Once | INTRAVENOUS | Status: AC | PRN
  Administered 2024-09-05: 100 mL via INTRAVENOUS

## 2024-09-05 MED ORDER — LORAZEPAM 2 MG/ML IJ SOLN
1.0000 mg | Freq: Once | INTRAMUSCULAR | Status: AC
  Administered 2024-09-05: 1 mg via INTRAVENOUS
  Filled 2024-09-05: qty 1

## 2024-09-05 NOTE — Discharge Instructions (Addendum)
 Please follow-up with your regular doctor as well as the specialist listed.  You will likely need to have periodic repeat imaging every 6 to 12 months to ensure no changing of your chest anatomy.  Please return for new or worsening symptoms.

## 2024-09-05 NOTE — ED Provider Notes (Signed)
 " [facility] Emergency Department Provider Note MRN:  [id number]  Arrival date & time: 09/05/24     Chief Complaint   Anxiety and Chest Pain   History of Present Illness   Catherine Diaz is a 44 y.o. year-old female presents to the ED with chief complaint of chest pain that started about an hour ago.  She states that she feels very anxious.  States that she has some pain that was in her back.  She complains of having numbness and tingling all over from the waist up.  She states that she smokes marijuana yesterday afternoon.  States that she smoked a cigarette earlier tonight.  She denies any other drugs or alcohol or medications.  Denies any known medical problems.  She states that she has been working really hard lately, but does not note significant stress.  She has never experienced symptoms like this before.  Denies any other associated symptoms.  History provided by patient.   Review of Systems  Pertinent positive and negative review of systems noted in HPI.    Physical Exam   Vitals:   09/05/24 0545 09/05/24 0600  BP: (!) 131/96   Pulse:  80  Resp: 11 12  Temp:    SpO2:  94%    CONSTITUTIONAL:  anxious-appearing, NAD NEURO:  Alert and oriented x 3, CN 3-12 grossly intact EYES:  eyes equal and reactive ENT/NECK:  Supple, no stridor  CARDIO:  normal rate, regular rhythm, appears well-perfused  PULM:  No respiratory distress, CTAB GI/GU:  non-distended, no focal abdominal tenderness MSK/SPINE:  No gross deformities, no edema, moves all extremities  SKIN:  no rash, atraumatic   *Additional and/or pertinent findings included in MDM below  Diagnostic and Interventional Summary    EKG Interpretation Date/Time:  Monday September 05 2024 03:53:07 EST Ventricular Rate:  87 PR Interval:  146 QRS Duration:  92 QT Interval:  384 QTC Calculation: 462 R Axis:   48  Text Interpretation: Sinus rhythm Borderline T abnormalities, inferior leads  Confirmed by Nettie, April ([id number]) on 09/05/2024 4:47:06 AM       Labs Reviewed  BASIC METABOLIC PANEL WITH GFR - Abnormal; Notable for the following components:      Result Value   Glucose, Bld 146 (*)    Creatinine, Ser 1.06 (*)    All other components within normal limits  CBC - Abnormal; Notable for the following components:   Hemoglobin 15.5 (*)    HCT 46.7 (*)    All other components within normal limits  HCG, SERUM, QUALITATIVE  URINE DRUG SCREEN  TROPONIN T, HIGH SENSITIVITY  TROPONIN T, HIGH SENSITIVITY    CT Angio Chest/Abd/Pel for Dissection W and/or Wo Contrast  Final Result    DG Chest 2 View  Final Result      Medications  LORazepam  (ATIVAN ) injection 1 mg (1 mg Intravenous Given 09/05/24 0417)  iohexol  (OMNIPAQUE ) 350 MG/ML injection 100 mL (100 mLs Intravenous Contrast Given 09/05/24 0517)     Procedures  /  Critical Care Procedures  ED Course and Medical Decision Making  I have reviewed the triage vital signs, the nursing notes, and pertinent available records from the EMR.  Social Determinants Affecting Complexity of Care: Patient has no clinically significant social determinants affecting this chief complaint..   ED Course: Clinical Course as of 09/05/24 0638  Mon Sep 05, 2024  0636 Troponin T, High Sensitivity Troponins x 2 are negative, nonischemic EKG, doubt ACS [RB]  [phone]  hCG, serum, qualitative Pregnancy test negative [RB]  0636 Basic metabolic panel(!) Mildly elevated creatinine, but no other significant findings on BMP. [RB]  [phone] CBC(!) No leukocytosis, no anemia [RB]  0637 CT Angio Chest/Abd/Pel for Dissection W and/or Wo Contrast CT ordered due to abnormal chest x-ray.  CT notable for a  Mild fusiform aneurysmal enlargement of the ascending aorta (40-42 mm); recommend CTA or MRA follow-up in 12 months.   I discussed these results with the patient, she will follow-up as directed.   [RB]  [id number] Patient feels significantly improved  after treatment and workup in the ED.  She was treated with IV Ativan .  I do think that most of her symptoms are anxiety driven.  Will have her also follow-up with her PCP. [RB]    Clinical Course User Index [RB] Vicky Charleston, PA-C    Medical Decision Making Patient here with chest pain that radiated into her back.  Started about 1 hour prior to arrival.  She denies any history of heart problems.  Denies any lung problems.  States that she feels anxious, but has not under a lot of stress.  She denies any shortness of breath.  Denies any fevers or chills.  Amount and/or Complexity of Data Reviewed Labs: ordered. Radiology: ordered.  Risk Prescription drug management.         Consultants: No consultations were needed in caring for this patient.   Treatment and Plan: I considered admission due to patient's initial presentation, but after considering the examination and diagnostic results, patient will not require admission and can be discharged with outpatient follow-up.  Patient discussed with attending physician, Dr. Palumbo, who agrees with plan.  Final Clinical Impressions(s) / ED Diagnoses     ICD-10-CM   1. Thoracic aortic aneurysm without rupture, unspecified part  I71.20     2. Anxiousness  F41.9       ED Discharge Orders     None         Discharge Instructions Discussed with and Provided to Patient:     Discharge Instructions      Please follow-up with your regular doctor as well as the specialist listed.  You will likely need to have periodic repeat imaging every 6 to 12 months to ensure no changing of your chest anatomy.  Please return for new or worsening symptoms.       Vicky Charleston, PA-C 09/05/24 [phone]  "

## 2024-09-05 NOTE — ED Triage Notes (Signed)
 Pt bib ems from home with c/o anxiety that started x 1 hour pta. Pt endorses chest and upper back pain.   124/76 [id number]

## 2024-09-19 ENCOUNTER — Ambulatory Visit: Payer: Self-pay
